# Patient Record
Sex: Female | Born: 1970 | Race: Black or African American | Hispanic: No | Marital: Single | Smoking: Never smoker
Health system: Southern US, Community
[De-identification: ages and names within clinical notes are randomized; demographics above are authoritative.]

## PROBLEM LIST (undated history)

## (undated) DIAGNOSIS — I10 Essential (primary) hypertension: Secondary | ICD-10-CM

## (undated) DIAGNOSIS — J45909 Unspecified asthma, uncomplicated: Secondary | ICD-10-CM

## (undated) DIAGNOSIS — R569 Unspecified convulsions: Secondary | ICD-10-CM

## (undated) DIAGNOSIS — D259 Leiomyoma of uterus, unspecified: Secondary | ICD-10-CM

## (undated) DIAGNOSIS — M797 Fibromyalgia: Secondary | ICD-10-CM

## (undated) DIAGNOSIS — K219 Gastro-esophageal reflux disease without esophagitis: Secondary | ICD-10-CM

## (undated) HISTORY — DX: Unspecified asthma, uncomplicated: J45.909

## (undated) HISTORY — DX: Essential (primary) hypertension: I10

---

## 1997-11-12 ENCOUNTER — Other Ambulatory Visit: Admission: RE | Admit: 1997-11-12 | Discharge: 1997-11-12 | Payer: Self-pay | Admitting: Obstetrics

## 1998-01-07 ENCOUNTER — Emergency Department (HOSPITAL_COMMUNITY): Admission: EM | Admit: 1998-01-07 | Discharge: 1998-01-08 | Payer: Self-pay | Admitting: Emergency Medicine

## 1998-02-26 ENCOUNTER — Emergency Department (HOSPITAL_COMMUNITY): Admission: EM | Admit: 1998-02-26 | Discharge: 1998-02-26 | Payer: Self-pay | Admitting: Emergency Medicine

## 1998-03-03 ENCOUNTER — Encounter: Payer: Self-pay | Admitting: Emergency Medicine

## 1998-03-03 ENCOUNTER — Emergency Department (HOSPITAL_COMMUNITY): Admission: EM | Admit: 1998-03-03 | Discharge: 1998-03-03 | Payer: Self-pay | Admitting: Emergency Medicine

## 1998-06-17 ENCOUNTER — Emergency Department (HOSPITAL_COMMUNITY): Admission: EM | Admit: 1998-06-17 | Discharge: 1998-06-17 | Payer: Self-pay | Admitting: Emergency Medicine

## 1998-08-14 ENCOUNTER — Emergency Department (HOSPITAL_COMMUNITY): Admission: EM | Admit: 1998-08-14 | Discharge: 1998-08-14 | Payer: Self-pay | Admitting: Emergency Medicine

## 1998-09-07 ENCOUNTER — Emergency Department (HOSPITAL_COMMUNITY): Admission: EM | Admit: 1998-09-07 | Discharge: 1998-09-07 | Payer: Self-pay | Admitting: Emergency Medicine

## 1998-09-07 ENCOUNTER — Encounter: Payer: Self-pay | Admitting: Emergency Medicine

## 1998-09-08 ENCOUNTER — Emergency Department (HOSPITAL_COMMUNITY): Admission: EM | Admit: 1998-09-08 | Discharge: 1998-09-08 | Payer: Self-pay | Admitting: Emergency Medicine

## 1998-10-23 ENCOUNTER — Encounter: Payer: Self-pay | Admitting: Emergency Medicine

## 1998-10-23 ENCOUNTER — Emergency Department (HOSPITAL_COMMUNITY): Admission: EM | Admit: 1998-10-23 | Discharge: 1998-10-23 | Payer: Self-pay | Admitting: Emergency Medicine

## 1999-01-14 ENCOUNTER — Encounter: Payer: Self-pay | Admitting: Emergency Medicine

## 1999-01-14 ENCOUNTER — Emergency Department (HOSPITAL_COMMUNITY): Admission: EM | Admit: 1999-01-14 | Discharge: 1999-01-15 | Payer: Self-pay | Admitting: Emergency Medicine

## 1999-03-30 ENCOUNTER — Emergency Department (HOSPITAL_COMMUNITY): Admission: EM | Admit: 1999-03-30 | Discharge: 1999-03-30 | Payer: Self-pay | Admitting: Emergency Medicine

## 1999-07-21 ENCOUNTER — Other Ambulatory Visit: Admission: RE | Admit: 1999-07-21 | Discharge: 1999-07-21 | Payer: Self-pay | Admitting: Obstetrics

## 1999-10-18 ENCOUNTER — Emergency Department (HOSPITAL_COMMUNITY): Admission: EM | Admit: 1999-10-18 | Discharge: 1999-10-19 | Payer: Self-pay | Admitting: Emergency Medicine

## 2000-07-31 ENCOUNTER — Other Ambulatory Visit: Admission: RE | Admit: 2000-07-31 | Discharge: 2000-07-31 | Payer: Self-pay | Admitting: Obstetrics

## 2001-03-17 ENCOUNTER — Emergency Department (HOSPITAL_COMMUNITY): Admission: EM | Admit: 2001-03-17 | Discharge: 2001-03-17 | Payer: Self-pay | Admitting: *Deleted

## 2001-10-24 ENCOUNTER — Emergency Department (HOSPITAL_COMMUNITY): Admission: EM | Admit: 2001-10-24 | Discharge: 2001-10-24 | Payer: Self-pay | Admitting: Emergency Medicine

## 2002-12-03 ENCOUNTER — Emergency Department (HOSPITAL_COMMUNITY): Admission: EM | Admit: 2002-12-03 | Discharge: 2002-12-03 | Payer: Self-pay | Admitting: Emergency Medicine

## 2002-12-20 ENCOUNTER — Encounter: Admission: RE | Admit: 2002-12-20 | Discharge: 2002-12-20 | Payer: Self-pay | Admitting: Internal Medicine

## 2003-02-26 ENCOUNTER — Emergency Department (HOSPITAL_COMMUNITY): Admission: AD | Admit: 2003-02-26 | Discharge: 2003-02-26 | Payer: Self-pay | Admitting: Family Medicine

## 2003-09-22 HISTORY — PX: OTHER SURGICAL HISTORY: SHX169

## 2004-01-26 HISTORY — PX: TARSAL TUNNEL RELEASE: SHX5042

## 2004-01-26 HISTORY — PX: NEURECTOMY FOOT: SUR888

## 2005-10-20 ENCOUNTER — Emergency Department (HOSPITAL_COMMUNITY): Admission: EM | Admit: 2005-10-20 | Discharge: 2005-10-20 | Payer: Self-pay | Admitting: Emergency Medicine

## 2005-11-09 ENCOUNTER — Encounter: Admission: RE | Admit: 2005-11-09 | Discharge: 2005-12-13 | Payer: Self-pay | Admitting: Podiatry

## 2006-02-24 ENCOUNTER — Emergency Department (HOSPITAL_COMMUNITY): Admission: EM | Admit: 2006-02-24 | Discharge: 2006-02-24 | Payer: Self-pay | Admitting: Emergency Medicine

## 2006-04-15 ENCOUNTER — Encounter: Admission: RE | Admit: 2006-04-15 | Discharge: 2006-04-15 | Payer: Self-pay | Admitting: Neurology

## 2007-05-07 ENCOUNTER — Emergency Department (HOSPITAL_COMMUNITY): Admission: EM | Admit: 2007-05-07 | Discharge: 2007-05-07 | Payer: Self-pay | Admitting: Emergency Medicine

## 2007-07-02 HISTORY — PX: TARSAL TUNNEL RELEASE: SHX5042

## 2007-07-02 HISTORY — PX: OTHER SURGICAL HISTORY: SHX169

## 2007-11-14 ENCOUNTER — Emergency Department (HOSPITAL_COMMUNITY): Admission: EM | Admit: 2007-11-14 | Discharge: 2007-11-14 | Payer: Self-pay | Admitting: Emergency Medicine

## 2008-02-09 ENCOUNTER — Emergency Department (HOSPITAL_COMMUNITY): Admission: EM | Admit: 2008-02-09 | Discharge: 2008-02-09 | Payer: Self-pay | Admitting: Emergency Medicine

## 2008-04-02 ENCOUNTER — Inpatient Hospital Stay (HOSPITAL_COMMUNITY): Admission: AD | Admit: 2008-04-02 | Discharge: 2008-04-02 | Payer: Self-pay | Admitting: Obstetrics

## 2008-06-09 ENCOUNTER — Encounter: Admission: RE | Admit: 2008-06-09 | Discharge: 2008-06-09 | Payer: Self-pay | Admitting: Neurology

## 2008-08-01 ENCOUNTER — Inpatient Hospital Stay (HOSPITAL_COMMUNITY): Admission: AD | Admit: 2008-08-01 | Discharge: 2008-08-01 | Payer: Self-pay | Admitting: Obstetrics

## 2008-08-27 ENCOUNTER — Emergency Department (HOSPITAL_COMMUNITY): Admission: EM | Admit: 2008-08-27 | Discharge: 2008-08-27 | Payer: Self-pay | Admitting: Emergency Medicine

## 2009-04-23 ENCOUNTER — Emergency Department (HOSPITAL_COMMUNITY): Admission: EM | Admit: 2009-04-23 | Discharge: 2009-04-23 | Payer: Self-pay | Admitting: Emergency Medicine

## 2009-11-20 ENCOUNTER — Emergency Department (HOSPITAL_COMMUNITY): Admission: EM | Admit: 2009-11-20 | Discharge: 2009-11-20 | Payer: Self-pay | Admitting: Emergency Medicine

## 2010-06-06 ENCOUNTER — Encounter: Payer: Self-pay | Admitting: Obstetrics

## 2010-07-29 ENCOUNTER — Inpatient Hospital Stay (HOSPITAL_COMMUNITY)
Admission: AD | Admit: 2010-07-29 | Discharge: 2010-07-29 | Disposition: A | Discharge status: Home / Self Care | Payer: Medicare Other | Source: Ambulatory Visit | Attending: Obstetrics | Admitting: Obstetrics

## 2010-07-29 DIAGNOSIS — N760 Acute vaginitis: Secondary | ICD-10-CM | POA: Insufficient documentation

## 2010-07-29 DIAGNOSIS — A499 Bacterial infection, unspecified: Secondary | ICD-10-CM | POA: Insufficient documentation

## 2010-07-29 DIAGNOSIS — B9689 Other specified bacterial agents as the cause of diseases classified elsewhere: Secondary | ICD-10-CM | POA: Insufficient documentation

## 2010-07-29 DIAGNOSIS — N949 Unspecified condition associated with female genital organs and menstrual cycle: Secondary | ICD-10-CM | POA: Insufficient documentation

## 2010-07-29 DIAGNOSIS — N390 Urinary tract infection, site not specified: Secondary | ICD-10-CM | POA: Insufficient documentation

## 2010-07-29 LAB — URINALYSIS, ROUTINE W REFLEX MICROSCOPIC
Ketones, ur: NEGATIVE mg/dL
Nitrite: POSITIVE — AB
Urobilinogen, UA: 1 mg/dL (ref 0.0–1.0)
pH: 6 (ref 5.0–8.0)

## 2010-07-29 LAB — WET PREP, GENITAL: Trich, Wet Prep: NONE SEEN

## 2010-07-29 LAB — URINE MICROSCOPIC-ADD ON

## 2010-07-29 LAB — POCT PREGNANCY, URINE: Preg Test, Ur: NEGATIVE

## 2010-07-30 LAB — GC/CHLAMYDIA PROBE AMP, GENITAL: GC Probe Amp, Genital: NEGATIVE

## 2010-07-31 LAB — URINE CULTURE
Colony Count: >=100000
Culture  Setup Time: 201203152225

## 2010-08-26 LAB — GC/CHLAMYDIA PROBE AMP, GENITAL
Chlamydia, DNA Probe: NEGATIVE
GC Probe Amp, Genital: NEGATIVE

## 2010-08-26 LAB — WET PREP, GENITAL: Yeast Wet Prep HPF POC: NONE SEEN

## 2010-10-07 ENCOUNTER — Ambulatory Visit: Payer: Medicare Other | Admitting: Family Medicine

## 2010-10-24 ENCOUNTER — Inpatient Hospital Stay (HOSPITAL_COMMUNITY)
Admission: AD | Admit: 2010-10-24 | Discharge: 2010-10-24 | Disposition: A | Discharge status: Home / Self Care | Payer: Medicare Other | Source: Ambulatory Visit | Attending: Obstetrics | Admitting: Obstetrics

## 2010-10-24 DIAGNOSIS — N949 Unspecified condition associated with female genital organs and menstrual cycle: Secondary | ICD-10-CM

## 2010-10-24 DIAGNOSIS — R109 Unspecified abdominal pain: Secondary | ICD-10-CM | POA: Insufficient documentation

## 2010-10-24 LAB — URINALYSIS, ROUTINE W REFLEX MICROSCOPIC
Glucose, UA: NEGATIVE mg/dL
Hgb urine dipstick: NEGATIVE
Protein, ur: NEGATIVE mg/dL
Specific Gravity, Urine: 1.025 (ref 1.005–1.030)
pH: 6 (ref 5.0–8.0)

## 2010-10-24 LAB — WET PREP, GENITAL: Yeast Wet Prep HPF POC: NONE SEEN

## 2010-10-25 LAB — POCT PREGNANCY, URINE: Preg Test, Ur: NEGATIVE

## 2010-10-26 LAB — GC/CHLAMYDIA PROBE AMP, GENITAL: Chlamydia, DNA Probe: NEGATIVE

## 2011-02-16 LAB — URINALYSIS, ROUTINE W REFLEX MICROSCOPIC
Glucose, UA: NEGATIVE
Hgb urine dipstick: NEGATIVE
Nitrite: NEGATIVE
Specific Gravity, Urine: 1.015

## 2011-02-16 LAB — WET PREP, GENITAL: Trich, Wet Prep: NONE SEEN

## 2011-02-16 LAB — GC/CHLAMYDIA PROBE AMP, GENITAL: Chlamydia, DNA Probe: NEGATIVE

## 2011-02-16 LAB — POCT PREGNANCY, URINE: Preg Test, Ur: NEGATIVE

## 2011-06-14 ENCOUNTER — Emergency Department (HOSPITAL_COMMUNITY)
Admission: EM | Admit: 2011-06-14 | Discharge: 2011-06-14 | Discharge status: Against Medical Advice | Payer: PRIVATE HEALTH INSURANCE | Attending: Emergency Medicine | Admitting: Emergency Medicine

## 2011-06-14 ENCOUNTER — Encounter (HOSPITAL_COMMUNITY): Payer: Self-pay | Admitting: *Deleted

## 2011-06-14 DIAGNOSIS — M2550 Pain in unspecified joint: Secondary | ICD-10-CM | POA: Insufficient documentation

## 2011-06-14 HISTORY — DX: Unspecified convulsions: R56.9

## 2011-06-14 HISTORY — DX: Fibromyalgia: M79.7

## 2011-06-14 NOTE — ED Notes (Addendum)
Patient states she has hx of fibromyalgia.  She states she has noted pain and aching and pins and needs in her left arm and hand.  She states it hurts when she moves her arm.

## 2012-08-06 ENCOUNTER — Encounter: Payer: Self-pay | Admitting: *Deleted

## 2012-08-07 ENCOUNTER — Encounter: Payer: Self-pay | Admitting: *Deleted

## 2012-08-08 ENCOUNTER — Encounter: Payer: Self-pay | Admitting: Podiatry

## 2012-08-08 ENCOUNTER — Ambulatory Visit (INDEPENDENT_AMBULATORY_CARE_PROVIDER_SITE_OTHER): Payer: Medicare Other | Admitting: Podiatry

## 2012-08-08 ENCOUNTER — Ambulatory Visit: Payer: Self-pay | Admitting: Podiatry

## 2012-08-08 VITALS — BP 132/95 | HR 69 | Ht 59.0 in | Wt 148.0 lb

## 2012-08-08 DIAGNOSIS — M25579 Pain in unspecified ankle and joints of unspecified foot: Secondary | ICD-10-CM | POA: Insufficient documentation

## 2012-08-08 MED ORDER — HYDROCODONE-IBUPROFEN 7.5-200 MG PO TABS: ORAL_TABLET | ORAL | Status: DC

## 2012-08-08 NOTE — Progress Notes (Signed)
Subjective: 42 y.o. year old female patient presents complaining of pain and swelling on both ankles x few weeks. Stated that she has been on her feet more than usual recently.  Stated that she attended school and will graduate this May, receiving GED [employer] School Diploma equivalent).   Reviewed medication, allergies, medial and surgical histories.   Review of Systems - General ROS: negative for - chills, fatigue, fever, hot flashes, malaise, night sweats, sleep disturbance, weight gain or weight loss.  Objective: Dermatologic:  Hypertrophic keloid right ankle from previous surgery (Tarsal tunnel release 09, Heel spur surgery 05) - part of the area is still tender when pressure is applied.  No new lesions, no open lesions noted.  Vascular: Pedal pulses are all palpable. No gross edema or erythema noted around ankle or foot, bilateral. Orthopedic: Mild Hallux valgus with bunion on right. No other gross deformities noted. Neurologic: All epicritic and tactile sensations grossly intact.  Assessment: Arthropathy ankles bilateral, possibly due to increase activity (Patient has to go up many stairs to get to her apartment).  Treatment: Reviewed clinical findings.  Discussed better shoe gear. Rx. Vicodin 7.5-200 as per request.

## 2012-08-08 NOTE — Patient Instructions (Addendum)
Seen for bilateral foot pain. Findings include normal ankle joint and rearfoot joints. No new findings since last visit. May have been on feet too much after been prolong inactivity.  Please exercise and gradually increase lower leg activity. May take one pain pill  Every 6 hrs if needed.

## 2012-08-20 ENCOUNTER — Telehealth: Payer: Self-pay | Admitting: Podiatry

## 2012-08-20 NOTE — Telephone Encounter (Signed)
Call this am, Request Rx for Plain Hydrocodone, Last rx made her have headaches and dizzy.

## 2012-10-24 ENCOUNTER — Ambulatory Visit: Payer: PRIVATE HEALTH INSURANCE | Admitting: Podiatry

## 2012-10-31 ENCOUNTER — Ambulatory Visit (INDEPENDENT_AMBULATORY_CARE_PROVIDER_SITE_OTHER): Payer: PRIVATE HEALTH INSURANCE | Admitting: Podiatry

## 2012-10-31 VITALS — BP 123/90 | HR 70

## 2012-10-31 DIAGNOSIS — M25579 Pain in unspecified ankle and joints of unspecified foot: Secondary | ICD-10-CM

## 2012-10-31 DIAGNOSIS — G5751 Tarsal tunnel syndrome, right lower limb: Secondary | ICD-10-CM

## 2012-10-31 DIAGNOSIS — G5750 Tarsal tunnel syndrome, unspecified lower limb: Secondary | ICD-10-CM

## 2012-10-31 MED ORDER — HYDROCODONE-IBUPROFEN 7.5-200 MG PO TABS: ORAL_TABLET | ORAL | Status: DC

## 2012-10-31 NOTE — Progress Notes (Signed)
Subjective: Right ankle swollen and pain x 2 weeks. Not bad if not on too long.  Pain on right foot at old surgery site at about incision line, (Tarsal tunnel surgery)started spreading and sets on ankle and toes. This is new problem. Her mother is sick with cancer and had to help her constantly going back and forth. 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 Patient is moving this September.   Objective: Very sensitive to touch along the old keloid tissue at Tarsal tunnel area. Much more pain than it used to be. Hallux valgus with bunion bilateral. Assessment: Tarsal tunnel syndrome, recurrent.   Plan:  Reviewed treatment options; injection, orthotics, shoe, pain medication, possible surgery.  Stated last injection made her almost pass out.  Patient wants to try pain medication at this time.  Explained she forms keloid and surgery may not be a good choice for elective surgery.

## 2012-12-21 ENCOUNTER — Ambulatory Visit: Payer: PRIVATE HEALTH INSURANCE | Admitting: Podiatry

## 2012-12-26 ENCOUNTER — Ambulatory Visit (INDEPENDENT_AMBULATORY_CARE_PROVIDER_SITE_OTHER): Payer: PRIVATE HEALTH INSURANCE | Admitting: Podiatry

## 2012-12-26 DIAGNOSIS — G5750 Tarsal tunnel syndrome, unspecified lower limb: Secondary | ICD-10-CM

## 2012-12-26 DIAGNOSIS — M25579 Pain in unspecified ankle and joints of unspecified foot: Secondary | ICD-10-CM

## 2012-12-26 DIAGNOSIS — G5751 Tarsal tunnel syndrome, right lower limb: Secondary | ICD-10-CM

## 2012-12-26 MED ORDER — HYDROCODONE-IBUPROFEN 7.5-200 MG PO TABS: ORAL_TABLET | ORAL | Status: DC

## 2012-12-26 NOTE — Progress Notes (Signed)
Pain on right ankle instep and over old incision. Using CAM walker and pain on top of foot. Patient request pain medication refilled.  Assessment: Recurrent Tarsal tunnel syndrome. Keloid formation at surgery site. Pain at surgery site.  P: Will switch to ankle brace from CAM walker. Patient refused cortisone injection in past repeatedly.  Prop up when swells. Pain pills as needed.

## 2013-01-22 ENCOUNTER — Telehealth: Payer: Self-pay | Admitting: Podiatry

## 2013-01-22 NOTE — Telephone Encounter (Signed)
Isabel Shaw called today stating could she get some more pain Med. She also said she has misplaced her last Rx Bottle and Cant find it. I told Isabel Shaw she will have to call back next week because Dr. Raynald Kemp is unavailable this week.

## 2013-01-29 ENCOUNTER — Telehealth: Payer: Self-pay | Admitting: Podiatry

## 2013-01-29 NOTE — Telephone Encounter (Signed)
Patient called today 01/29/2013 requesting a RX for Foot pain, stating she has misplaced her Medicine last given to her. Pt did call last week while Dr. Raynald Kemp was away also.

## 2013-03-30 ENCOUNTER — Emergency Department (INDEPENDENT_AMBULATORY_CARE_PROVIDER_SITE_OTHER)
Admission: EM | Admit: 2013-03-30 | Discharge: 2013-03-30 | Disposition: A | Discharge status: Home / Self Care | Payer: PRIVATE HEALTH INSURANCE | Source: Home / Self Care | Attending: Family Medicine | Admitting: Family Medicine

## 2013-03-30 ENCOUNTER — Encounter (HOSPITAL_COMMUNITY): Payer: Self-pay | Admitting: Emergency Medicine

## 2013-03-30 DIAGNOSIS — K0889 Other specified disorders of teeth and supporting structures: Secondary | ICD-10-CM

## 2013-03-30 DIAGNOSIS — K089 Disorder of teeth and supporting structures, unspecified: Secondary | ICD-10-CM

## 2013-03-30 MED ORDER — MELOXICAM 15 MG PO TABS: 15.0000 mg | ORAL_TABLET | Freq: Every day | ORAL | Status: DC | PRN

## 2013-03-30 MED ORDER — AMOXICILLIN 500 MG PO CAPS: 500.0000 mg | ORAL_CAPSULE | Freq: Three times a day (TID) | ORAL | Status: DC

## 2013-03-30 NOTE — ED Notes (Signed)
Pt c/o dental pain onset 3 weeks on left upper side Sxs also include: swelling and pain that radiates to left side of head She is alert w/no signs of acute distress.

## 2013-03-30 NOTE — ED Provider Notes (Signed)
Isabel Shaw is a 42 y.o. female who presents to Urgent Care today for tooth pain for 3 weeks off and on upper left tooth. Patient notes a crack in her upper left incisor.  The pain worsened recently. She has not tried any medications. She does have a [employer] insurance but has not contacted the dentist. She feels well otherwise no fevers or chills nausea vomiting or diarrhea.   Past Medical History  Diagnosis Date  . Fibromyalgia   . Seizures   . Asthma    History  Substance Use Topics  . Smoking status: Never Smoker   . Smokeless tobacco: Never Used  . Alcohol Use: No   ROS as above Medications reviewed. No current facility-administered medications for this encounter.   Current Outpatient Prescriptions  Medication Sig Dispense Refill  . amitriptyline (ELAVIL) 50 MG tablet Take 50 mg by mouth at bedtime.      Marland Kitchen amoxicillin (AMOXIL) 500 MG capsule Take 1 capsule (500 mg total) by mouth 3 (three) times daily.  30 capsule  0  . azithromycin (ZITHROMAX) 500 MG tablet Take 500 mg by mouth daily.       . fluticasone (FLOVENT HFA) 220 MCG/ACT inhaler Inhale 1 puff into the lungs 2 (two) times daily.      . Fluticasone-Salmeterol (ADVAIR) 250-50 MCG/DOSE AEPB Inhale 1 puff into the lungs every 12 (twelve) hours.      . gabapentin (NEURONTIN) 300 MG capsule Take 600 mg by mouth 2 (two) times daily.      Marland Kitchen HYDROcodone-ibuprofen (VICOPROFEN) 7.5-200 MG per tablet Take one every 8-12 hours for pain as needed.  90 tablet  0  . ibuprofen (ADVIL,MOTRIN) 800 MG tablet       . levETIRAcetam (KEPPRA) 500 MG tablet Take 500 mg by mouth 2 (two) times daily.      . meloxicam (MOBIC) 15 MG tablet Take 1 tablet (15 mg total) by mouth daily as needed for pain.  30 tablet  0  . predniSONE (DELTASONE) 10 MG tablet Take 10 mg by mouth 2 (two) times daily.      Marland Kitchen rOPINIRole (REQUIP) 0.25 MG tablet Take 0.25 mg by mouth every evening.      Marland Kitchen tiZANidine (ZANAFLEX) 4 MG tablet Take 4 mg by mouth  every 6 (six) hours as needed. For muscle spasms      . zolpidem (AMBIEN) 10 MG tablet Take 10 mg by mouth at bedtime as needed. For sleep        Exam:  BP 127/91  Pulse 61  Temp(Src) 98.1 F (36.7 C) (Oral)  Resp 18  SpO2 99%  LMP 03/06/2013 Gen: Well NAD HEENT: EOMI,  MMM.  Tooth: Small crack extending to the base of the tooth upper left incisor. Tender to touch. No significant surrounding erythema.   No results found for this or any previous visit (from the past 24 hour(s)). No results found.  Assessment and Plan: 42 y.o. female with tooth pain.  Plan to treat with meloxicam and amoxicillin. Followup with dentist. Discussed warning signs or symptoms. Please see discharge instructions. Patient expresses understanding.      Rodolph Bong, MD 03/30/13 1130

## 2013-05-28 ENCOUNTER — Encounter: Payer: Self-pay | Admitting: Podiatry

## 2013-05-28 ENCOUNTER — Ambulatory Visit (INDEPENDENT_AMBULATORY_CARE_PROVIDER_SITE_OTHER): Payer: PRIVATE HEALTH INSURANCE | Admitting: Podiatry

## 2013-05-28 VITALS — BP 143/92 | HR 67

## 2013-05-28 DIAGNOSIS — M21611 Bunion of right foot: Secondary | ICD-10-CM | POA: Insufficient documentation

## 2013-05-28 DIAGNOSIS — M21619 Bunion of unspecified foot: Secondary | ICD-10-CM

## 2013-05-28 DIAGNOSIS — M25579 Pain in unspecified ankle and joints of unspecified foot: Secondary | ICD-10-CM

## 2013-05-28 MED ORDER — HYDROCODONE-IBUPROFEN 7.5-200 MG PO TABS: ORAL_TABLET | ORAL | Status: DC

## 2013-05-28 NOTE — Patient Instructions (Signed)
Seen for pain on right foot. Noted of enlarged bunion on right foot.  Pain pill prescription renewed. Return as needed.

## 2013-05-28 NOTE — Progress Notes (Signed)
Foot rolls to side and hurts on top and side of foot when on feet. Pain stays on constantly as long as she is on her feet. Patient request pain medication refilled.  Objective: Status post Tarsal tunnel surgery more than 10 years ago and has build up excess keloid at medial aspect of the right ankle along the incision line.  No edema or erythema noted.  Positive of hallux valgus with bunion deformity right.   Assessment: Symptomatic HAV with bunion right.  Pain upon ambulation.   Plan:  Reviewed findings.  Rx renewed for Hydrocodone.

## 2013-07-23 ENCOUNTER — Telehealth: Payer: Self-pay | Admitting: *Deleted

## 2013-07-23 MED ORDER — HYDROCODONE-IBUPROFEN 7.5-200 MG PO TABS: ORAL_TABLET | ORAL | Status: DC

## 2013-07-23 NOTE — Telephone Encounter (Signed)
Patient request refill on her vicodin please.

## 2013-09-17 ENCOUNTER — Encounter: Payer: Self-pay | Admitting: Podiatry

## 2013-09-17 ENCOUNTER — Telehealth: Payer: Self-pay | Admitting: *Deleted

## 2013-09-17 ENCOUNTER — Ambulatory Visit (INDEPENDENT_AMBULATORY_CARE_PROVIDER_SITE_OTHER): Payer: PRIVATE HEALTH INSURANCE | Admitting: Podiatry

## 2013-09-17 VITALS — BP 131/98 | HR 56 | Ht 59.0 in | Wt 144.0 lb

## 2013-09-17 DIAGNOSIS — L989 Disorder of the skin and subcutaneous tissue, unspecified: Secondary | ICD-10-CM | POA: Insufficient documentation

## 2013-09-17 DIAGNOSIS — M25579 Pain in unspecified ankle and joints of unspecified foot: Secondary | ICD-10-CM

## 2013-09-17 DIAGNOSIS — R234 Changes in skin texture: Secondary | ICD-10-CM | POA: Insufficient documentation

## 2013-09-17 MED ORDER — HYDROCODONE-ACETAMINOPHEN 7.5-325 MG PO TABS: 1.0000 | ORAL_TABLET | Freq: Four times a day (QID) | ORAL | Status: DC | PRN

## 2013-09-17 MED ORDER — DIMETHICONE 2 % EX CREA: 1.0000 "application " | TOPICAL_CREAM | Freq: Two times a day (BID) | CUTANEOUS | Status: DC

## 2013-09-17 NOTE — Patient Instructions (Signed)
Seen for painful scar. Mederma sample dispensed and rx sent. Pain medication also prescribed as per request. Also may benefit from cortisone injection.  Return as needed.

## 2013-09-17 NOTE — Progress Notes (Signed)
Subjective: 43 year old female presents complaining of pain from right ankle scar that swells and rubs in shoes. Scar on right medial ankle from old incision from Tarsal tunnel release surgery done about 10 years or longer.  Pain gets bad and some times prevents her from standing on her feet. Shooting or stabbing on both feet. Right foot hurts at scarred area, left foot hurt at plantar arch area.   Objective: Severe keloid problem on right ankle from previous surgery at tarsal tunnel area.  Subjective: Painful scar right ankle.   Plan: Will try Mederma. Pain medication prescribed. Return as needed.

## 2013-09-17 NOTE — Telephone Encounter (Signed)
Call from [facility] with question @ electronic Rx sent for Pt. [phone]  He said it was for Aveeno.. Over the counter. We looked in chart and told him what it was.

## 2013-10-09 ENCOUNTER — Other Ambulatory Visit: Payer: Self-pay

## 2013-10-10 ENCOUNTER — Encounter (HOSPITAL_COMMUNITY): Payer: Self-pay | Admitting: Emergency Medicine

## 2013-10-10 ENCOUNTER — Emergency Department (INDEPENDENT_AMBULATORY_CARE_PROVIDER_SITE_OTHER): Payer: PRIVATE HEALTH INSURANCE

## 2013-10-10 ENCOUNTER — Emergency Department (INDEPENDENT_AMBULATORY_CARE_PROVIDER_SITE_OTHER)
Admission: EM | Admit: 2013-10-10 | Discharge: 2013-10-10 | Disposition: A | Discharge status: Home / Self Care | Payer: PRIVATE HEALTH INSURANCE | Source: Home / Self Care | Attending: Family Medicine | Admitting: Family Medicine

## 2013-10-10 DIAGNOSIS — M79609 Pain in unspecified limb: Secondary | ICD-10-CM

## 2013-10-10 DIAGNOSIS — M79672 Pain in left foot: Secondary | ICD-10-CM

## 2013-10-10 MED ORDER — DICLOFENAC SODIUM 50 MG PO TBEC: 50.0000 mg | DELAYED_RELEASE_TABLET | Freq: Two times a day (BID) | ORAL | Status: DC | PRN

## 2013-10-10 NOTE — Discharge Instructions (Signed)
Thank you for coming in today. Use your rigid sole shoe.  Take diclofenac for pain as needed Followup with your foot doctor soon.

## 2013-10-10 NOTE — ED Provider Notes (Signed)
Isabel Shaw is a 43 y.o. female who presents to Urgent Care today for left foot pain. Patient notes Pain in the left dorsal foot 4 days without injury. The pain is felt at the mid tarsal metatarsal joints. There is worse with prolonged standing and better with rest. No radiating pain weakness or numbness. She has no tried any medications yet. She feels well otherwise.   Past Medical History  Diagnosis Date  . Fibromyalgia   . Seizures   . Asthma    History  Substance Use Topics  . Smoking status: Never Smoker   . Smokeless tobacco: Never Used  . Alcohol Use: No   ROS as above Medications: No current facility-administered medications for this encounter.   Current Outpatient Prescriptions  Medication Sig Dispense Refill  . amitriptyline (ELAVIL) 50 MG tablet Take 50 mg by mouth at bedtime.      . diclofenac (VOLTAREN) 50 MG EC tablet Take 1 tablet (50 mg total) by mouth 2 (two) times daily as needed.  60 tablet  0  . DIMETHICONE, TOPICAL, 2 % CREA Apply 1 application topically 2 (two) times daily.  30 g  2  . fluticasone (FLONASE) 50 MCG/ACT nasal spray       . fluticasone (FLOVENT HFA) 220 MCG/ACT inhaler Inhale 1 puff into the lungs 2 (two) times daily.      . Fluticasone-Salmeterol (ADVAIR) 250-50 MCG/DOSE AEPB Inhale 1 puff into the lungs every 12 (twelve) hours.      . gabapentin (NEURONTIN) 300 MG capsule Take 600 mg by mouth 2 (two) times daily.      Marland Kitchen HYDROcodone-acetaminophen (NORCO) 7.5-325 MG per tablet Take 1 tablet by mouth every 6 (six) hours as needed for moderate pain.  60 tablet  0  . levETIRAcetam (KEPPRA) 500 MG tablet Take 500 mg by mouth 2 (two) times daily.      Marland Kitchen rOPINIRole (REQUIP) 0.25 MG tablet Take 0.25 mg by mouth every evening.      Marland Kitchen tiZANidine (ZANAFLEX) 4 MG tablet Take 4 mg by mouth every 6 (six) hours as needed. For muscle spasms      . zolpidem (AMBIEN) 10 MG tablet Take 10 mg by mouth at bedtime as needed. For sleep        Exam:  BP 129/89   Pulse 64  Temp(Src) 98.6 F (37 C) (Oral)  SpO2 99%  LMP 09/26/2013 Gen: Well NAD Left foot. Normal-appearing. No significant breakdown of the arch. Tender palpation across the dorsal midfoot at the TMT joint. Pain with rotation of the foot. The ankle is nontender with full motion and stable ligamentous exam. Refill and pulses are intact distally. No ecchymosis or swelling noted.  No results found for this or any previous visit (from the past 24 hour(s)). Dg Foot Complete Left  10/10/2013   CLINICAL DATA:  Dorsal LEFT foot pain for 3 days, no known injury  EXAM: LEFT FOOT - COMPLETE 3+ VIEW  COMPARISON:  None.  FINDINGS: Osseous mineralization normal.  Joint spaces preserved.  No fracture, dislocation, or bone destruction.  IMPRESSION: Normal exam.   Electronically Signed   By: Lavonia Dana M.D.   On: 10/10/2013 20:13    Assessment and Plan: 43 y.o. female with midfoot pain without clear explanation. Possible overuse. Doubtful for Lisfranc injury given no injury history.  Plan for postoperative shoe and NSAIDs. Followup podiatry in a few days.  Discussed warning signs or symptoms. Please see discharge instructions. Patient expresses understanding.  Gregor Hams, MD 10/10/13 2029

## 2013-10-10 NOTE — ED Notes (Signed)
Pt c/o pain on top of left foot onset 4 days Denies inj/trauma but states she's on her feet all day at work Pain increases w/activity and when bearing wt.  Alert w/no signs of acute distress.

## 2013-10-18 ENCOUNTER — Encounter: Payer: Self-pay | Admitting: Podiatry

## 2013-10-18 ENCOUNTER — Ambulatory Visit (INDEPENDENT_AMBULATORY_CARE_PROVIDER_SITE_OTHER): Payer: PRIVATE HEALTH INSURANCE | Admitting: Podiatry

## 2013-10-18 VITALS — BP 123/88 | HR 63

## 2013-10-18 DIAGNOSIS — M25579 Pain in unspecified ankle and joints of unspecified foot: Secondary | ICD-10-CM

## 2013-10-18 DIAGNOSIS — M722 Plantar fascial fibromatosis: Secondary | ICD-10-CM | POA: Insufficient documentation

## 2013-10-18 MED ORDER — HYDROCODONE-ACETAMINOPHEN 7.5-325 MG PO TABS: 1.0000 | ORAL_TABLET | Freq: Four times a day (QID) | ORAL | Status: DC | PRN

## 2013-10-18 NOTE — Patient Instructions (Signed)
Seen for pain in left foot arch area.  May benefit from well supported Tennis shoes with inserts (Custom or OTC).  May take NSAIA (Voltaren) and pain pill if needed.

## 2013-10-18 NOTE — Progress Notes (Signed)
Subjective: Pain under left foot arch area. She was seen at urgent care last week. Pain is like coming from a knot. Works two days a week.   Objective: Dermatologic: Normal skin without open lesions. Vascular: All pedal pulses are palpable. In edema or erythema. No increase in temperature noted. Neurologic: All epicritic and tactile sensations grossly intact.  Orthopedic: Hallux valgus with bunion left. Hypermobile first ray left. Pain under instep plantar fascial area with pressure. No palpable mass noted.  Assessment: Acute plantar fasciitis at center mid portion of foot.  Plan:  May benefit from well supported Tennis shoes with inserts (Custom or OTC).  May take NSAIA (Voltaren) and pain pill if needed.

## 2013-12-11 ENCOUNTER — Ambulatory Visit: Payer: PRIVATE HEALTH INSURANCE | Admitting: Podiatry

## 2013-12-20 ENCOUNTER — Encounter: Payer: Self-pay | Admitting: Podiatry

## 2013-12-20 ENCOUNTER — Ambulatory Visit (INDEPENDENT_AMBULATORY_CARE_PROVIDER_SITE_OTHER): Payer: PRIVATE HEALTH INSURANCE | Admitting: Podiatry

## 2013-12-20 VITALS — BP 143/102 | HR 66

## 2013-12-20 DIAGNOSIS — M25572 Pain in left ankle and joints of left foot: Secondary | ICD-10-CM

## 2013-12-20 DIAGNOSIS — M25579 Pain in unspecified ankle and joints of unspecified foot: Secondary | ICD-10-CM

## 2013-12-20 DIAGNOSIS — M21962 Unspecified acquired deformity of left lower leg: Secondary | ICD-10-CM | POA: Insufficient documentation

## 2013-12-20 DIAGNOSIS — M21969 Unspecified acquired deformity of unspecified lower leg: Secondary | ICD-10-CM

## 2013-12-20 MED ORDER — HYDROCODONE-ACETAMINOPHEN 7.5-325 MG PO TABS: 1.0000 | ORAL_TABLET | Freq: Four times a day (QID) | ORAL | Status: DC | PRN

## 2013-12-20 NOTE — Patient Instructions (Signed)
Seen for pain in left ankle. Both feet have bunion and abnormal bone alignment. Due to existing Keloid problem, surgery will be reserved for a last resort.  Pain medication prescribed as per request.

## 2013-12-20 NOTE — Progress Notes (Signed)
Left foot pain during ambulation at ankle joint. Patient is wearing flat sandals. Both ankles swell with certain weather.   Objective: Hallux valgus with bunion bilateral. Excess sagittal plane motion first ray bilateral. Good ankle joint motion. Existing keloid on right medial ankle.   Assessment: Pain left ankle joint. HAV with bunion bilateral. Hypermobile first ray bilateral.  Plan: Reviewed findings and available options. Patient is to wear laced up shoes. Patient has keloid problem and surgery will be reserved as a last resort.  Pain medication prescribed as per request.

## 2014-02-28 ENCOUNTER — Ambulatory Visit (INDEPENDENT_AMBULATORY_CARE_PROVIDER_SITE_OTHER): Payer: PRIVATE HEALTH INSURANCE | Admitting: Podiatry

## 2014-02-28 ENCOUNTER — Encounter: Payer: Self-pay | Admitting: Podiatry

## 2014-02-28 VITALS — BP 144/94 | HR 69

## 2014-02-28 DIAGNOSIS — M21962 Unspecified acquired deformity of left lower leg: Secondary | ICD-10-CM

## 2014-02-28 DIAGNOSIS — M722 Plantar fascial fibromatosis: Secondary | ICD-10-CM

## 2014-02-28 MED ORDER — HYDROCODONE-ACETAMINOPHEN 7.5-325 MG PO TABS: 1.0000 | ORAL_TABLET | Freq: Four times a day (QID) | ORAL | Status: DC | PRN

## 2014-02-28 NOTE — Progress Notes (Signed)
Subjective: 43 year old female presents complaining of pain on left foot.  Been working 3 days a week 5 hours on feet x 2 weeks. Left foot hurt more than right.  Ran out of pain pills that she had to take at bed time. Pain is at instep area left foot.   Objective: Neurovascular status are within normal.  Pain under left arch. Excess first ray sagittal plane motion bilateral.  History of keloid formation right ankle.  Assessment: Plantar fasciitis left.   Plan: Proper shoe gear, lace up tennis shoes. Cutdown on hours on feet to tolerable level.  Pain pill only if needed. Advised not to rake it as a daily routine.

## 2014-02-28 NOTE — Patient Instructions (Signed)
Seen for left arch pain. Need to reduce hours on feet, wear well supported shoes, and take pain pill only if needed. Return as needed.

## 2014-05-21 ENCOUNTER — Emergency Department (HOSPITAL_COMMUNITY)
Admission: EM | Admit: 2014-05-21 | Discharge: 2014-05-21 | Disposition: A | Discharge status: Home / Self Care | Payer: Medicare Other | Attending: Emergency Medicine | Admitting: Emergency Medicine

## 2014-05-21 ENCOUNTER — Encounter (HOSPITAL_COMMUNITY): Payer: Self-pay | Admitting: *Deleted

## 2014-05-21 DIAGNOSIS — R0600 Dyspnea, unspecified: Secondary | ICD-10-CM

## 2014-05-21 DIAGNOSIS — R42 Dizziness and giddiness: Secondary | ICD-10-CM | POA: Diagnosis present

## 2014-05-21 DIAGNOSIS — Z79899 Other long term (current) drug therapy: Secondary | ICD-10-CM | POA: Insufficient documentation

## 2014-05-21 DIAGNOSIS — R0602 Shortness of breath: Secondary | ICD-10-CM | POA: Diagnosis not present

## 2014-05-21 DIAGNOSIS — R202 Paresthesia of skin: Secondary | ICD-10-CM | POA: Diagnosis not present

## 2014-05-21 DIAGNOSIS — M797 Fibromyalgia: Secondary | ICD-10-CM | POA: Diagnosis not present

## 2014-05-21 DIAGNOSIS — J45901 Unspecified asthma with (acute) exacerbation: Secondary | ICD-10-CM | POA: Insufficient documentation

## 2014-05-21 DIAGNOSIS — Z7951 Long term (current) use of inhaled steroids: Secondary | ICD-10-CM | POA: Insufficient documentation

## 2014-05-21 DIAGNOSIS — G40909 Epilepsy, unspecified, not intractable, without status epilepticus: Secondary | ICD-10-CM | POA: Insufficient documentation

## 2014-05-21 LAB — CBC
HCT: 38.8 % (ref 36.0–46.0)
Hemoglobin: 13.1 g/dL (ref 12.0–15.0)
MCH: 26.9 pg (ref 26.0–34.0)
MCHC: 33.8 g/dL (ref 30.0–36.0)
MCV: 79.7 fL (ref 78.0–100.0)
Platelets: 324 10*3/uL (ref 150–400)
RBC: 4.87 MIL/uL (ref 3.87–5.11)
RDW: 13.9 % (ref 11.5–15.5)
WBC: 5.6 10*3/uL (ref 4.0–10.5)

## 2014-05-21 LAB — BASIC METABOLIC PANEL
Anion gap: 7 (ref 5–15)
BUN: 8 mg/dL (ref 6–23)
CALCIUM: 9.2 mg/dL (ref 8.4–10.5)
CO2: 24 mmol/L (ref 19–32)
CREATININE: 0.79 mg/dL (ref 0.50–1.10)
Chloride: 107 mEq/L (ref 96–112)
GFR calc non Af Amer: >90 mL/min (ref >90)
GLUCOSE: 88 mg/dL (ref 70–99)
Potassium: 3.5 mmol/L (ref 3.5–5.1)
SODIUM: 138 mmol/L (ref 135–145)

## 2014-05-21 LAB — I-STAT TROPONIN, ED: TROPONIN I, POC: 0 ng/mL (ref 0.00–0.08)

## 2014-05-21 NOTE — ED Notes (Signed)
PT monitored by pulse ox, bp cuff, and 5-lead. 

## 2014-05-21 NOTE — ED Provider Notes (Signed)
CSN: [id number]     Arrival date & time 05/21/14  1549 History   First MD Initiated Contact with Patient 05/21/14 1719     Chief Complaint  Patient presents with  . Dizziness  . Fatigue     (Consider location/radiation/quality/duration/timing/severity/associated sxs/prior Treatment) Patient is a 44 y.o. female presenting with general illness.  Illness Location:  Generalized, bil ue Quality:  Weakness, dyspnea, tingling Severity:  Moderate Onset quality:  Sudden Duration: 30 min. Timing:  Constant Progression:  Resolved Chronicity:  Recurrent Context:  Ho fibromyalgia, currently under social stress Relieved by:  Nothing Worsened by:  Nothing Associated symptoms: shortness of breath   Associated symptoms: no chest pain, no cough, no fever, no headaches, no loss of consciousness, no nausea, no vomiting and no wheezing     Past Medical History  Diagnosis Date  . Fibromyalgia   . Seizures   . Asthma    Past Surgical History  Procedure Laterality Date  . Heel spur resect w/plantar fasciotomy Right 09-22-03    Right foot  . Neurectomy foot Right 01/26/04    Right foot, Plantar calc.  . Tarsal tunnel release Right 01/26/04    Right foot  . Neuroplasty digital n. Right 07/02/07    Right x 3  . Tarsal tunnel release Right 07/02/07    Right foot   History reviewed. No pertinent family history. History  Substance Use Topics  . Smoking status: Never Smoker   . Smokeless tobacco: Never Used  . Alcohol Use: No   OB History    No data available     Review of Systems  Constitutional: Negative for fever.  Respiratory: Positive for shortness of breath. Negative for cough and wheezing.   Cardiovascular: Negative for chest pain.  Gastrointestinal: Negative for nausea and vomiting.  Neurological: Negative for loss of consciousness and headaches.  All other systems reviewed and are negative.     Allergies  Review of patient's allergies indicates no known allergies.  Home  Medications   Prior to Admission medications   Medication Sig Start Date End Date Taking? Authorizing Provider  amitriptyline (ELAVIL) 50 MG tablet Take 50 mg by mouth at bedtime.    Historical Provider, MD  diclofenac (VOLTAREN) 50 MG EC tablet Take 1 tablet (50 mg total) by mouth 2 (two) times daily as needed. 10/10/13   Gregor Hams, MD  DIMETHICONE, TOPICAL, 2 % CREA Apply 1 application topically 2 (two) times daily. 09/17/13   Myeong Sheard, DPM  fluticasone (FLONASE) 50 MCG/ACT nasal spray  07/22/13   Historical Provider, MD  fluticasone (FLOVENT HFA) 220 MCG/ACT inhaler Inhale 1 puff into the lungs 2 (two) times daily.    Historical Provider, MD  Fluticasone-Salmeterol (ADVAIR) 250-50 MCG/DOSE AEPB Inhale 1 puff into the lungs every 12 (twelve) hours.    Historical Provider, MD  gabapentin (NEURONTIN) 300 MG capsule Take 600 mg by mouth 2 (two) times daily.    Historical Provider, MD  HYDROcodone-acetaminophen (NORCO) 7.5-325 MG per tablet Take 1 tablet by mouth every 6 (six) hours as needed for moderate pain. 02/28/14   Myeong Sheard, DPM  levETIRAcetam (KEPPRA) 500 MG tablet Take 500 mg by mouth 2 (two) times daily.    Historical Provider, MD  rOPINIRole (REQUIP) 0.25 MG tablet Take 0.25 mg by mouth every evening.    Historical Provider, MD  tiZANidine (ZANAFLEX) 4 MG tablet Take 4 mg by mouth every 6 (six) hours as needed. For muscle spasms    Historical Provider, MD  zolpidem (AMBIEN) 10 MG tablet Take 10 mg by mouth at bedtime as needed. For sleep    Historical Provider, MD   BP 135/92 mmHg  Pulse 61  Temp(Src) 97.6 F (36.4 C) (Oral)  Resp 20  SpO2 100% Physical Exam  Constitutional: She is oriented to person, place, and time. She appears well-developed and well-nourished.  HENT:  Head: Normocephalic and atraumatic.  Right Ear: External ear normal.  Left Ear: External ear normal.  Eyes: Conjunctivae and EOM are normal. Pupils are equal, round, and reactive to light.  Neck:  Normal range of motion. Neck supple.  Cardiovascular: Normal rate, regular rhythm, normal heart sounds and intact distal pulses.   Pulmonary/Chest: Effort normal and breath sounds normal.  Abdominal: Soft. Bowel sounds are normal. There is no tenderness.  Musculoskeletal: Normal range of motion.  Neurological: She is alert and oriented to person, place, and time. She has normal strength and normal reflexes. No cranial nerve deficit or sensory deficit. Gait normal. GCS eye subscore is 4. GCS verbal subscore is 5. GCS motor subscore is 6.  Skin: Skin is warm and dry.  Vitals reviewed.   ED Course  Procedures (including critical care time) Labs Review Labs Reviewed  [facility], ED    Imaging Review No results found.   EKG Interpretation   Date/Time:  Wednesday May 21 2014 15:57:58 EST Ventricular Rate:  69 PR Interval:  136 QRS Duration: 88 QT Interval:  398 QTC Calculation: 426 R Axis:   34 Text Interpretation:  Normal sinus rhythm Normal ECG No significant change  since last tracing Confirmed by Debby Freiberg [phone]) on 05/21/2014  5:45:54 PM      MDM   Final diagnoses:  Tingling in extremities  Dyspnea    44 y.o. female with pertinent PMH of fibromyalgia, seizures presents with recurrent symptoms as described above. Historically patient states that she is under a large deal of stress, states that her symptoms might be due to this. On arrival vital signs and physical exam as above. No focal neurodeficits, benign exam. As patient describes bilateral upper [place] tingling that is associated with shortness of breath and stress, consider anxiety most likely etiology of her symptoms. Did discuss the potential of occult pathology including strict return precautions for PE, CVA, MS. Offered patient follow-up with neurology and cardiology, she declined stating that she would follow-up with PCP. Discharged home in stable condition..    I  have reviewed all laboratory and imaging studies if ordered as above  1. Tingling in extremities   2. Dyspnea         Debby Freiberg, MD 05/21/14 [phone]

## 2014-05-21 NOTE — Discharge Instructions (Signed)
Generalized Anxiety Disorder Generalized anxiety disorder (GAD) is a mental disorder. It interferes with life functions, including relationships, work, and school. GAD is different from normal anxiety, which everyone experiences at some point in their lives in response to specific life events and activities. Normal anxiety actually helps us prepare for and get through these life events and activities. Normal anxiety goes away after the event or activity is over.  GAD causes anxiety that is not necessarily related to specific events or activities. It also causes excess anxiety in proportion to specific events or activities. The anxiety associated with GAD is also difficult to control. GAD can vary from mild to severe. People with severe GAD can have intense waves of anxiety with physical symptoms (panic attacks).  SYMPTOMS The anxiety and worry associated with GAD are difficult to control. This anxiety and worry are related to many life events and activities and also occur more days than not for 6 months or longer. People with GAD also have three or more of the following symptoms (one or more in children):  Restlessness.   Fatigue.  Difficulty concentrating.   Irritability.  Muscle tension.  Difficulty sleeping or unsatisfying sleep. DIAGNOSIS GAD is diagnosed through an assessment by your health care provider. Your health care provider will ask you questions aboutyour mood,physical symptoms, and events in your life. Your health care provider may ask you about your medical history and use of alcohol or drugs, including prescription medicines. Your health care provider may also do a physical exam and blood tests. Certain medical conditions and the use of certain substances can cause symptoms similar to those associated with GAD. Your health care provider may refer you to a mental health specialist for further evaluation. TREATMENT The following therapies are usually used to treat GAD:    Medication. Antidepressant medication usually is prescribed for long-term daily control. Antianxiety medicines may be added in severe cases, especially when panic attacks occur.   Talk therapy (psychotherapy). Certain types of talk therapy can be helpful in treating GAD by providing support, education, and guidance. A form of talk therapy called cognitive behavioral therapy can teach you healthy ways to think about and react to daily life events and activities.  Stress managementtechniques. These include yoga, meditation, and exercise and can be very helpful when they are practiced regularly. A mental health specialist can help determine which treatment is best for you. Some people see improvement with one therapy. However, other people require a combination of therapies. Document Released: 08/27/2012 Document Revised: 09/16/2013 Document Reviewed: 08/27/2012 Exit[employer] Patient Information 2015 [employer]. This information is not intended to replace advice given to you by your health care provider. Make sure you discuss any questions you have with your health care provider.  

## 2014-05-21 NOTE — ED Notes (Signed)
NAD at this time. Pt is stable and leaving with her son.

## 2014-05-21 NOTE — ED Notes (Signed)
Pt in c/o dizziness and generalized weakness for several months, states symptoms are intermittent, denies pain, states symptoms were worse today

## 2014-07-14 ENCOUNTER — Ambulatory Visit: Payer: Medicaid Other | Admitting: Podiatry

## 2014-07-14 DIAGNOSIS — Z131 Encounter for screening for diabetes mellitus: Secondary | ICD-10-CM | POA: Diagnosis not present

## 2014-07-14 DIAGNOSIS — M609 Myositis, unspecified: Secondary | ICD-10-CM | POA: Diagnosis not present

## 2014-07-14 DIAGNOSIS — N760 Acute vaginitis: Secondary | ICD-10-CM | POA: Diagnosis not present

## 2014-07-14 DIAGNOSIS — Z13220 Encounter for screening for lipoid disorders: Secondary | ICD-10-CM | POA: Diagnosis not present

## 2014-07-14 DIAGNOSIS — J4520 Mild intermittent asthma, uncomplicated: Secondary | ICD-10-CM | POA: Diagnosis not present

## 2014-07-15 ENCOUNTER — Ambulatory Visit: Payer: Medicaid Other | Admitting: Podiatry

## 2014-07-18 ENCOUNTER — Encounter: Payer: Self-pay | Admitting: Podiatry

## 2014-07-18 ENCOUNTER — Ambulatory Visit (INDEPENDENT_AMBULATORY_CARE_PROVIDER_SITE_OTHER): Payer: Medicare Other | Admitting: Podiatry

## 2014-07-18 VITALS — BP 130/85 | HR 65

## 2014-07-18 DIAGNOSIS — M79673 Pain in unspecified foot: Secondary | ICD-10-CM

## 2014-07-18 DIAGNOSIS — M21969 Unspecified acquired deformity of unspecified lower leg: Secondary | ICD-10-CM

## 2014-07-18 DIAGNOSIS — M21962 Unspecified acquired deformity of left lower leg: Secondary | ICD-10-CM

## 2014-07-18 DIAGNOSIS — M79606 Pain in leg, unspecified: Secondary | ICD-10-CM

## 2014-07-18 MED ORDER — HYDROCODONE-ACETAMINOPHEN 7.5-325 MG PO TABS: 1.0000 | ORAL_TABLET | Freq: Four times a day (QID) | ORAL | Status: DC | PRN

## 2014-07-18 NOTE — Progress Notes (Signed)
Subjective: 44 year old female presents complaining of foot pain left>right. Feels like walking on bone.  Patient points proximal lateral 1/2 of left foot being painful area. Works 4 hours, 2 days a week. Patient wants to have pain medication.   Objective: Hypermobile first ray with bunion deformity bilateral.  Neurovascular status are within normal. Old keloid remain the same on right foot. No open skin lesions.  Assessment: Tenosynovitis left>right foot. 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  Plan: Reviewed findings and available options. Patient requests pain medication at this time.

## 2014-07-18 NOTE — Patient Instructions (Signed)
Seen for pain in left foot. Rx. Prescribed as per request. Return as needed.

## 2014-08-02 ENCOUNTER — Encounter (HOSPITAL_COMMUNITY): Payer: Self-pay | Admitting: General Practice

## 2014-08-02 ENCOUNTER — Inpatient Hospital Stay (HOSPITAL_COMMUNITY)
Admission: AD | Admit: 2014-08-02 | Discharge: 2014-08-02 | Disposition: A | Discharge status: Home / Self Care | Payer: Medicare Other | Source: Ambulatory Visit | Attending: Obstetrics & Gynecology | Admitting: Obstetrics & Gynecology

## 2014-08-02 ENCOUNTER — Inpatient Hospital Stay (HOSPITAL_COMMUNITY): Payer: Medicare Other

## 2014-08-02 DIAGNOSIS — R109 Unspecified abdominal pain: Secondary | ICD-10-CM | POA: Insufficient documentation

## 2014-08-02 DIAGNOSIS — D259 Leiomyoma of uterus, unspecified: Secondary | ICD-10-CM | POA: Diagnosis not present

## 2014-08-02 DIAGNOSIS — M797 Fibromyalgia: Secondary | ICD-10-CM | POA: Diagnosis not present

## 2014-08-02 DIAGNOSIS — R102 Pelvic and perineal pain: Secondary | ICD-10-CM

## 2014-08-02 DIAGNOSIS — N939 Abnormal uterine and vaginal bleeding, unspecified: Secondary | ICD-10-CM | POA: Diagnosis not present

## 2014-08-02 DIAGNOSIS — N946 Dysmenorrhea, unspecified: Secondary | ICD-10-CM | POA: Insufficient documentation

## 2014-08-02 LAB — URINALYSIS, ROUTINE W REFLEX MICROSCOPIC
Bilirubin Urine: NEGATIVE
GLUCOSE, UA: NEGATIVE mg/dL
Hgb urine dipstick: NEGATIVE
KETONES UR: NEGATIVE mg/dL
Leukocytes, UA: NEGATIVE
NITRITE: NEGATIVE
PH: 6 (ref 5.0–8.0)
Protein, ur: NEGATIVE mg/dL
Specific Gravity, Urine: 1.015 (ref 1.005–1.030)
UROBILINOGEN UA: 0.2 mg/dL (ref 0.0–1.0)

## 2014-08-02 LAB — WET PREP, GENITAL
Clue Cells Wet Prep HPF POC: NONE SEEN
Trich, Wet Prep: NONE SEEN
WBC, Wet Prep HPF POC: NONE SEEN
Yeast Wet Prep HPF POC: NONE SEEN

## 2014-08-02 LAB — POCT PREGNANCY, URINE: PREG TEST UR: NEGATIVE

## 2014-08-02 MED ORDER — KETOROLAC TROMETHAMINE 60 MG/2ML IM SOLN: 60.0000 mg | Freq: Once | INTRAMUSCULAR | Status: DC

## 2014-08-02 NOTE — MAU Note (Signed)
Pt presents to MAU with complaints of abdominal pain yesterday. Denies any vaginal bleeding or discharge

## 2014-08-02 NOTE — MAU Provider Note (Signed)
History     CSN: [id number]  Arrival date and time: 08/02/14 1217   First Provider Initiated Contact with Patient 08/02/14 1307      Chief Complaint  Patient presents with  . Abdominal Pain   HPIpt is not pregnant and presents for vaginal pain with discharge- same partner for 7 mos- last had sex last week Pt has had a tubal ligation Pt also has mid abdominal pain radiating to her back- bilateral- pt states pain is worse after intercourse- pt has noticed pain mostly over past week Pt denies burning with urination, nausea/vomiting Pt has periods 2 times month lasting 4 days with bad cramps- no clots LMP 07/26/2014 with PMP 09/03/2014 Pt takes ibuprofen 800mg  per Dr. Darliss Ridgel for dysmenorrhea Pt has fibromyalgia Seizures- Dr.Crystal Rose(last seizure 2 years ago- started 6 years ago) Fibromyalgia- Dr. Erling Cruz in [place] Pain level is 4/10- at worst is 8/10   Signed Nursing MAU Note 08/02/2014 12:28 PM    Expand All Collapse All   Pt presents to MAU with complaints of abdominal pain yesterday. Denies any vaginal bleeding or discharge       Past Medical History  Diagnosis Date  . Fibromyalgia   . Seizures   . Asthma     Past Surgical History  Procedure Laterality Date  . Heel spur resect w/plantar fasciotomy Right 09-22-03    Right foot  . Neurectomy foot Right 01/26/04    Right foot, Plantar calc.  . Tarsal tunnel release Right 01/26/04    Right foot  . Neuroplasty digital n. Right 07/02/07    Right x 3  . Tarsal tunnel release Right 07/02/07    Right foot    History reviewed. No pertinent family history.  History  Substance Use Topics  . Smoking status: Never Smoker   . Smokeless tobacco: Never Used  . Alcohol Use: No    Allergies: No Known Allergies  Prescriptions prior to admission  Medication Sig Dispense Refill Last Dose  . amitriptyline (ELAVIL) 50 MG tablet Take 50 mg by mouth at bedtime.   Taking  . diclofenac (VOLTAREN) 50 MG EC tablet Take 1  tablet (50 mg total) by mouth 2 (two) times daily as needed. 60 tablet 0 Taking  . DIMETHICONE, TOPICAL, 2 % CREA Apply 1 application topically 2 (two) times daily. 30 g 2 Taking  . fluticasone (FLONASE) 50 MCG/ACT nasal spray    Taking  . fluticasone (FLOVENT HFA) 220 MCG/ACT inhaler Inhale 1 puff into the lungs 2 (two) times daily.   Taking  . Fluticasone-Salmeterol (ADVAIR) 250-50 MCG/DOSE AEPB Inhale 1 puff into the lungs every 12 (twelve) hours.   Taking  . gabapentin (NEURONTIN) 300 MG capsule Take 600 mg by mouth 2 (two) times daily.   Taking  . HYDROcodone-acetaminophen (NORCO) 7.5-325 MG per tablet Take 1 tablet by mouth every 6 (six) hours as needed for moderate pain. 60 tablet 0   . levETIRAcetam (KEPPRA) 500 MG tablet Take 500 mg by mouth 2 (two) times daily.   Taking  . rOPINIRole (REQUIP) 0.25 MG tablet Take 0.25 mg by mouth every evening.   Taking  . tiZANidine (ZANAFLEX) 4 MG tablet Take 4 mg by mouth every 6 (six) hours as needed. For muscle spasms   Taking  . zolpidem (AMBIEN) 10 MG tablet Take 10 mg by mouth at bedtime as needed. For sleep   Taking    Review of Systems  Constitutional: Negative for fever and chills.  Gastrointestinal: Positive for  abdominal pain. Negative for nausea, vomiting, diarrhea and constipation.  Genitourinary: Negative for dysuria and urgency.  Musculoskeletal: Positive for back pain.   Physical Exam   Blood pressure 130/99, pulse 76, temperature 97.9 F (36.6 C), temperature source Oral, resp. rate 18, last menstrual period 07/26/2014.  Physical Exam  Nursing note and vitals reviewed. Constitutional: She is oriented to person, place, and time. She appears well-developed and well-nourished. No distress.  HENT:  Head: Normocephalic.  Eyes: Pupils are equal, round, and reactive to light.  Neck: Normal range of motion. Neck supple.  Cardiovascular: Normal rate.   Respiratory: Effort normal.  GI: Soft. There is tenderness. There is no rebound  and no guarding.  Genitourinary:  Mod amount of white thin discharge in vault; cervix NT; uterus slightly irregular ant; right adnexa tender with palpation- no rebound- difficult to fully assess adnexa due to pt's discomfort  Musculoskeletal: Normal range of motion.  Neurological: She is alert and oriented to person, place, and time.  Skin: Skin is warm and dry.  Psychiatric: She has a normal mood and affect.    MAU Course  Procedures Pt refused Toradol injection for pain-pt anxious about having injection Results for orders placed or performed during the hospital encounter of 08/02/14 (from the past 24 hour(s))  Urinalysis, Routine w reflex microscopic     Status: None   Collection Time: 08/02/14 12:30 PM  Result Value Ref Range   Color, Urine YELLOW YELLOW   APPearance CLEAR CLEAR   Specific Gravity, Urine 1.015 1.005 - 1.030   pH 6.0 5.0 - 8.0   Glucose, UA NEGATIVE NEGATIVE mg/dL   Hgb urine dipstick NEGATIVE NEGATIVE   Bilirubin Urine NEGATIVE NEGATIVE   Ketones, ur NEGATIVE NEGATIVE mg/dL   Protein, ur NEGATIVE NEGATIVE mg/dL   Urobilinogen, UA 0.2 0.0 - 1.0 mg/dL   Nitrite NEGATIVE NEGATIVE   Leukocytes, UA NEGATIVE NEGATIVE  Pregnancy, urine POC     Status: None   Collection Time: 08/02/14 12:54 PM  Result Value Ref Range   Preg Test, Ur NEGATIVE NEGATIVE  Wet prep, genital     Status: None   Collection Time: 08/02/14  1:27 PM  Result Value Ref Range   Yeast Wet Prep HPF POC NONE SEEN NONE SEEN   Trich, Wet Prep NONE SEEN NONE SEEN   Clue Cells Wet Prep HPF POC NONE SEEN NONE SEEN   WBC, Wet Prep HPF POC NONE SEEN NONE SEEN  GC/Chlamydia culture pending HIV/RPR refused- did not want needle stick- pt said she was tested last year [place]- preliminary report-showed right uterine fibroid; left and right ovary not visualized by transabdominal; transvaginal exam terminated due to pt's discomfort Encouraged pt to take Ibuprofen/Tylenol and heating pain and to follow up with [facility]  outpatient clinic for further evaluation- note sent to clinic to call pt with appointment Pt aware that exam was limited today- pt to return if increase in pain or bleeding Pt to keep track of bleeding with APP on phone to report when seen in GYN clinic Encouraged pt to use lubricant with sex- could take NSAID before sex Assessment and Plan  Abdominal pain abnormal uterine bleeding F/u with GYN clinic  [facility] 08/02/2014, 1:08 PM

## 2014-08-04 LAB — GC/CHLAMYDIA PROBE AMP (~~LOC~~) NOT AT ARMC
Chlamydia: NEGATIVE
Neisseria Gonorrhea: NEGATIVE

## 2014-08-11 ENCOUNTER — Ambulatory Visit (INDEPENDENT_AMBULATORY_CARE_PROVIDER_SITE_OTHER): Payer: Medicare Other | Admitting: Family Medicine

## 2014-08-11 ENCOUNTER — Encounter: Payer: Self-pay | Admitting: Family Medicine

## 2014-08-11 VITALS — BP 123/90 | HR 69 | Ht 59.0 in | Wt 150.9 lb

## 2014-08-11 DIAGNOSIS — N926 Irregular menstruation, unspecified: Secondary | ICD-10-CM | POA: Diagnosis not present

## 2014-08-11 MED ORDER — MEGESTROL ACETATE 40 MG PO TABS: 40.0000 mg | ORAL_TABLET | Freq: Every day | ORAL | Status: DC

## 2014-08-11 NOTE — Progress Notes (Signed)
   Subjective:    Patient ID: Isabel Shaw, female    DOB: [birth year], 44 y.o.   MRN: [id number]  HPI This is a 44 year old G5 P0 414 who presents to the clinic with irregular menses. The patient has been having moderate to heavy bleeding for 4-5 days every other week for the past 2 years. Patient was seen in the MAU for this irregular bleeding, as well as pelvic pain. The pelvic pain is resolved. The bleeding has no provoking or palliating factors. She had an ultrasound performed in the MAU which showed a fibroid that measured 1.4 x 1.1 x 1 cm that was intramural in the right posterior fundal aspect of the uterus. Her endometrial thickness was 7.5 mm.   Review of Systems  Constitutional: Negative for fever, chills and fatigue.  Respiratory: Negative for shortness of breath.   Cardiovascular: Negative for chest pain.  Endocrine: Negative for cold intolerance, heat intolerance, polydipsia, polyphagia and polyuria.  Genitourinary: Negative for urgency, hematuria, decreased urine volume, vaginal discharge, vaginal pain and pelvic pain.       Objective:   Physical Exam  Constitutional: She is oriented to person, place, and time. She appears well-developed and well-nourished.  Cardiovascular: Normal rate, regular rhythm and normal heart sounds.   Pulmonary/Chest: Effort normal and breath sounds normal.  Abdominal: Soft. Bowel sounds are normal. She exhibits no distension and no mass. There is no tenderness. There is no rebound and no guarding.  Musculoskeletal: She exhibits no edema.  Neurological: She is alert and oriented to person, place, and time.  Skin: Skin is warm and dry.  Psychiatric: She has a normal mood and affect. Her behavior is normal. Judgment and thought content normal.      Assessment & Plan:   Problem List Items Addressed This Visit    None    Visit Diagnoses    Irregular bleeding    -  Primary    Relevant Orders    TSH    Hemoglobin A1c      We'll check TSH  and hemoglobin A1c, although low likelihood as to the etiology of the patient's irregular bleeding. Her bleeding is likely related to the fibroid. With her endometrial stripe being 7.5 mm, I do not feel that endometrial biopsy would be of high yield, unless she were to failed medical treatment. Discussed treatment options with the patient, including oral medications, injections, IUD, and a mutual ablation, hysterectomy. Patient would like to try oral medications first. We'll start the patient on Megace. Patient to follow-up in 4-6 months. Will call patient with results of tests.

## 2014-08-11 NOTE — Progress Notes (Signed)
Pt reports having two periods per month for the past two years. Pt not sure she can tolerate endo bx. Would like to discuss further with provider.

## 2014-08-12 LAB — TSH: TSH: 0.803 u[IU]/mL (ref 0.350–4.500)

## 2014-08-12 LAB — HEMOGLOBIN A1C
HEMOGLOBIN A1C: 5.4 % (ref <5.7)
Mean Plasma Glucose: 108 mg/dL (ref <117)

## 2014-08-14 ENCOUNTER — Telehealth: Payer: Self-pay

## 2014-08-14 NOTE — Telephone Encounter (Signed)
-----   Message from Truett Mainland, DO sent at 08/14/2014  2:32 PM EDT ----- Please let pt know that labs are normal

## 2014-08-14 NOTE — Telephone Encounter (Signed)
Called patient and informed her of normal labs. Patient verbalized understanding and gratitude. No questions or concerns.

## 2014-08-29 ENCOUNTER — Ambulatory Visit (INDEPENDENT_AMBULATORY_CARE_PROVIDER_SITE_OTHER): Payer: Medicare Other | Admitting: Podiatry

## 2014-08-29 ENCOUNTER — Encounter: Payer: Self-pay | Admitting: Podiatry

## 2014-08-29 VITALS — BP 159/97 | HR 70

## 2014-08-29 DIAGNOSIS — M722 Plantar fascial fibromatosis: Secondary | ICD-10-CM | POA: Diagnosis not present

## 2014-08-29 DIAGNOSIS — M21962 Unspecified acquired deformity of left lower leg: Secondary | ICD-10-CM

## 2014-08-29 MED ORDER — HYDROCODONE-ACETAMINOPHEN 7.5-325 MG PO TABS: 1.0000 | ORAL_TABLET | Freq: Four times a day (QID) | ORAL | Status: DC | PRN

## 2014-08-29 NOTE — Progress Notes (Signed)
Subjective: Been to work wearing Tennis shoes. Arch of both feet hurt and feels like walking on bone. Using an arch support, Dr. Zoe Lan.  On feet 4 hours/day and 3 days/week. Taking Neurontin for Fibromyalgia.  Stated that she needs pain pill at night.   Objective: Dermatologic: Old eschar on right medial ankle from Tarsal tunnel surgery. Neurologic: Vascular:  Orthopedic:  Assessment: Elevated first ray bilateral. Plantar fasciitis.  Plan: Pain pill renewed.

## 2014-08-29 NOTE — Patient Instructions (Signed)
Seen for pain on both arches. Need good arch supports. Pain medication renewed.  Return as needed.

## 2014-09-03 ENCOUNTER — Encounter: Payer: Medicare Other | Admitting: Family Medicine

## 2014-10-06 DIAGNOSIS — D259 Leiomyoma of uterus, unspecified: Secondary | ICD-10-CM | POA: Diagnosis not present

## 2014-10-06 DIAGNOSIS — J4520 Mild intermittent asthma, uncomplicated: Secondary | ICD-10-CM | POA: Diagnosis not present

## 2014-10-06 DIAGNOSIS — E669 Obesity, unspecified: Secondary | ICD-10-CM | POA: Diagnosis not present

## 2014-10-06 DIAGNOSIS — E559 Vitamin D deficiency, unspecified: Secondary | ICD-10-CM | POA: Diagnosis not present

## 2014-10-06 DIAGNOSIS — R569 Unspecified convulsions: Secondary | ICD-10-CM | POA: Diagnosis not present

## 2014-10-21 ENCOUNTER — Telehealth: Payer: Self-pay | Admitting: *Deleted

## 2014-10-21 MED ORDER — HYDROCODONE-ACETAMINOPHEN 7.5-325 MG PO TABS: 1.0000 | ORAL_TABLET | Freq: Four times a day (QID) | ORAL | Status: DC | PRN

## 2014-10-21 NOTE — Telephone Encounter (Signed)
Pt called and requested pain med refill

## 2014-11-21 DIAGNOSIS — G43019 Migraine without aura, intractable, without status migrainosus: Secondary | ICD-10-CM | POA: Diagnosis not present

## 2014-11-21 DIAGNOSIS — G4709 Other insomnia: Secondary | ICD-10-CM | POA: Diagnosis not present

## 2014-11-21 DIAGNOSIS — G40309 Generalized idiopathic epilepsy and epileptic syndromes, not intractable, without status epilepticus: Secondary | ICD-10-CM | POA: Diagnosis not present

## 2014-11-21 DIAGNOSIS — G2581 Restless legs syndrome: Secondary | ICD-10-CM | POA: Diagnosis not present

## 2014-11-27 DIAGNOSIS — Z1231 Encounter for screening mammogram for malignant neoplasm of breast: Secondary | ICD-10-CM | POA: Diagnosis not present

## 2014-12-02 DIAGNOSIS — N6001 Solitary cyst of right breast: Secondary | ICD-10-CM | POA: Diagnosis not present

## 2015-01-05 DIAGNOSIS — J302 Other seasonal allergic rhinitis: Secondary | ICD-10-CM | POA: Diagnosis not present

## 2015-01-05 DIAGNOSIS — D259 Leiomyoma of uterus, unspecified: Secondary | ICD-10-CM | POA: Diagnosis not present

## 2015-01-05 DIAGNOSIS — M609 Myositis, unspecified: Secondary | ICD-10-CM | POA: Diagnosis not present

## 2015-01-05 DIAGNOSIS — J4520 Mild intermittent asthma, uncomplicated: Secondary | ICD-10-CM | POA: Diagnosis not present

## 2015-01-05 DIAGNOSIS — E669 Obesity, unspecified: Secondary | ICD-10-CM | POA: Diagnosis not present

## 2015-02-12 ENCOUNTER — Ambulatory Visit (INDEPENDENT_AMBULATORY_CARE_PROVIDER_SITE_OTHER): Payer: Medicare Other | Admitting: Family Medicine

## 2015-02-12 ENCOUNTER — Encounter: Payer: Self-pay | Admitting: Family Medicine

## 2015-02-12 VITALS — BP 134/97 | HR 77 | Temp 98.5°F | Ht 59.0 in | Wt 168.3 lb

## 2015-02-12 DIAGNOSIS — N926 Irregular menstruation, unspecified: Secondary | ICD-10-CM

## 2015-02-12 MED ORDER — MEGESTROL ACETATE 40 MG PO TABS: 40.0000 mg | ORAL_TABLET | Freq: Two times a day (BID) | ORAL | Status: DC

## 2015-02-12 NOTE — Progress Notes (Signed)
   Subjective:    Patient ID: Isabel Shaw, female    DOB: Mar 21, 1971, 44 y.o.   MRN: [id number]  HPI Patient seen for 6 month follow up from [facility].  Bleeding is a lot less than before, although not completely controlled.  Still has bleeding about half the days in a week.  Changes pad about 3 times daily. Still has significant cramping.    Review of Systems  Constitutional: Negative for fever, chills and fatigue.  Gastrointestinal: Negative for nausea, vomiting, abdominal pain and diarrhea.  Genitourinary: Negative for decreased urine volume, vaginal discharge, vaginal pain and dyspareunia.       Objective:   Physical Exam  Constitutional: She is oriented to person, place, and time. She appears well-developed and well-nourished.  Neck: Normal range of motion. Neck supple. No thyromegaly present.  Cardiovascular: Normal rate, regular rhythm and normal heart sounds.   Pulmonary/Chest: Effort normal and breath sounds normal.  Abdominal: Soft. Bowel sounds are normal. She exhibits no distension and no mass. There is no tenderness. There is no rebound and no guarding.  Neurological: She is alert and oriented to person, place, and time.  Skin: Skin is warm and dry.      Assessment & Plan:  1. Irregular bleeding Discussed IUD vs continuing megace.  Patient prefers to stay on megace.  Increase megace to 40mg  BID.  Patient to call in 2 weeks if not controlled.  F/u 6 months.

## 2015-02-16 ENCOUNTER — Telehealth: Payer: Self-pay | Admitting: Family Medicine

## 2015-02-16 ENCOUNTER — Other Ambulatory Visit: Payer: Self-pay | Admitting: Family Medicine

## 2015-02-16 MED ORDER — MEGESTROL ACETATE 40 MG PO TABS: 40.0000 mg | ORAL_TABLET | Freq: Two times a day (BID) | ORAL | Status: DC

## 2015-02-16 NOTE — Telephone Encounter (Signed)
Opened in error

## 2015-02-24 ENCOUNTER — Encounter: Payer: Self-pay | Admitting: Podiatry

## 2015-02-24 ENCOUNTER — Ambulatory Visit (INDEPENDENT_AMBULATORY_CARE_PROVIDER_SITE_OTHER): Payer: Medicare Other | Admitting: Podiatry

## 2015-02-24 VITALS — BP 152/95 | HR 75

## 2015-02-24 DIAGNOSIS — M722 Plantar fascial fibromatosis: Secondary | ICD-10-CM | POA: Insufficient documentation

## 2015-02-24 DIAGNOSIS — M21962 Unspecified acquired deformity of left lower leg: Secondary | ICD-10-CM

## 2015-02-24 DIAGNOSIS — M25579 Pain in unspecified ankle and joints of unspecified foot: Secondary | ICD-10-CM | POA: Diagnosis not present

## 2015-02-24 MED ORDER — HYDROCODONE-ACETAMINOPHEN 7.5-325 MG PO TABS: 1.0000 | ORAL_TABLET | Freq: Four times a day (QID) | ORAL | Status: DC | PRN

## 2015-02-24 NOTE — Patient Instructions (Signed)
Seen for pain and swelling in both feet.  Pain medication prescribed as requested.

## 2015-02-24 NOTE — Progress Notes (Signed)
Subjective: 44 year old female presents complaining of bilateral foot pain and swelling.  Was diagnosed with Uterine fibroid and been treated with steroid that resulted in gaining weight, about 20 lbs.  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 Having pain and swollen foot bilateral starting last months.  Taking Neurontin for Fibromyalgia.   Stated that she needs pain pill at night.   Objective: Dermatologic: Old eschar on right medial ankle from Tarsal tunnel surgery. Neurologic: Failed to respond to Monofilament sensory testing on right. Normal response on left.  Vascular: All pedal pulses are palpable bilateral. No edema or erythema bilateral.  Orthopedic: Bilateral bunion deformity.   Assessment: Elevated first ray bilateral. Plantar fasciitis bilateral.  Right ankle pain at Tarsal tunnel area right.   Plan: Pain prescription renewed

## 2015-04-06 DIAGNOSIS — D259 Leiomyoma of uterus, unspecified: Secondary | ICD-10-CM | POA: Diagnosis not present

## 2015-04-06 DIAGNOSIS — Z6834 Body mass index (BMI) 34.0-34.9, adult: Secondary | ICD-10-CM | POA: Diagnosis not present

## 2015-04-06 DIAGNOSIS — E559 Vitamin D deficiency, unspecified: Secondary | ICD-10-CM | POA: Diagnosis not present

## 2015-04-06 DIAGNOSIS — E669 Obesity, unspecified: Secondary | ICD-10-CM | POA: Diagnosis not present

## 2015-04-06 DIAGNOSIS — J4520 Mild intermittent asthma, uncomplicated: Secondary | ICD-10-CM | POA: Diagnosis not present

## 2015-05-19 ENCOUNTER — Encounter (HOSPITAL_COMMUNITY): Payer: Self-pay | Admitting: *Deleted

## 2015-05-19 DIAGNOSIS — M797 Fibromyalgia: Secondary | ICD-10-CM | POA: Insufficient documentation

## 2015-05-19 DIAGNOSIS — J45901 Unspecified asthma with (acute) exacerbation: Secondary | ICD-10-CM | POA: Insufficient documentation

## 2015-05-19 DIAGNOSIS — Z79899 Other long term (current) drug therapy: Secondary | ICD-10-CM | POA: Diagnosis not present

## 2015-05-19 DIAGNOSIS — J069 Acute upper respiratory infection, unspecified: Secondary | ICD-10-CM | POA: Insufficient documentation

## 2015-05-19 DIAGNOSIS — R05 Cough: Secondary | ICD-10-CM | POA: Diagnosis present

## 2015-05-19 DIAGNOSIS — Z7951 Long term (current) use of inhaled steroids: Secondary | ICD-10-CM | POA: Insufficient documentation

## 2015-05-19 NOTE — ED Notes (Signed)
The pt has had a cold and a cough for 3 days.  Her cough is getting worse.  Hx of asthma no distress no wheezing  lmp 12 months ago

## 2015-05-20 ENCOUNTER — Emergency Department (HOSPITAL_COMMUNITY)
Admission: EM | Admit: 2015-05-20 | Discharge: 2015-05-20 | Disposition: A | Discharge status: Home / Self Care | Payer: Medicare Other | Attending: Emergency Medicine | Admitting: Emergency Medicine

## 2015-05-20 ENCOUNTER — Emergency Department (HOSPITAL_COMMUNITY): Payer: Medicare Other

## 2015-05-20 DIAGNOSIS — J069 Acute upper respiratory infection, unspecified: Secondary | ICD-10-CM

## 2015-05-20 MED ORDER — BENZONATATE 100 MG PO CAPS: 100.0000 mg | ORAL_CAPSULE | Freq: Three times a day (TID) | ORAL | Status: DC

## 2015-05-20 MED ORDER — DEXAMETHASONE 4 MG PO TABS
10.0000 mg | ORAL_TABLET | Freq: Once | ORAL | Status: AC
  Administered 2015-05-20: 10 mg via ORAL
  Filled 2015-05-20: qty 3

## 2015-05-20 NOTE — Discharge Instructions (Signed)

## 2015-05-20 NOTE — ED Provider Notes (Signed)
CSN: [id number]     Arrival date & time 05/19/15  2233 History  By signing my name below, I, Soijett Blue, attest that this documentation has been prepared under the direction and in the presence of Deno Etienne, DO. Electronically Signed: Soijett Blue, ED Scribe. 05/20/2015. 3:35 AM.   Chief Complaint  Patient presents with  . Cough      The history is provided by the patient. No language interpreter was used.    Isabel Shaw is a 45 y.o. female with a PMHx of asthma who presents to the Emergency Department complaining of gradually worsening cough onset  2 days. She notes that she feels like she has to use her inhaler more often and she has positive sick contacts of her grandchildren. She states that she is having associated symptoms of wheezing and CP due to cough. She states that she has not tried any medications for the  relief for her symptoms. She denies facial pain and any other symptoms.   Past Medical History  Diagnosis Date  . Fibromyalgia   . Seizures ([place])   . Asthma    Past Surgical History  Procedure Laterality Date  . Heel spur resect w/plantar fasciotomy Right 09-22-03    Right foot  . Neurectomy foot Right 01/26/04    Right foot, Plantar calc.  . Tarsal tunnel release Right 01/26/04    Right foot  . Neuroplasty digital n. Right 07/02/07    Right x 3  . Tarsal tunnel release Right 07/02/07    Right foot   No family history on file. Social History  Substance Use Topics  . Smoking status: Never Smoker   . Smokeless tobacco: Never Used  . Alcohol Use: No   OB History    Gravida Para Term Preterm AB TAB SAB Ectopic Multiple Living   5 4  4 1  1   4      Review of Systems  Constitutional: Negative for fever and chills.  HENT: Negative for congestion and rhinorrhea.   Eyes: Negative for redness and visual disturbance.  Respiratory: Positive for cough and wheezing. Negative for shortness of breath.   Cardiovascular: Positive for chest pain (due to cough).  Negative for palpitations.  Gastrointestinal: Negative for nausea and vomiting.  Genitourinary: Negative for dysuria and urgency.  Musculoskeletal: Negative for myalgias and arthralgias.  Skin: Negative for pallor and wound.  Neurological: Negative for dizziness and headaches.      Allergies  Review of patient's allergies indicates no known allergies.  Home Medications   Prior to Admission medications   Medication Sig Start Date End Date Taking? Authorizing Provider  albuterol (PROVENTIL HFA;VENTOLIN HFA) 108 (90 BASE) MCG/ACT inhaler Inhale 2 puffs into the lungs every 6 (six) hours as needed for wheezing or shortness of breath.   Yes Historical Provider, MD  amitriptyline (ELAVIL) 50 MG tablet Take 50 mg by mouth at bedtime.   Yes Historical Provider, MD  fluticasone (FLONASE) 50 MCG/ACT nasal spray Place 1 spray into both nostrils daily.  07/22/13  Yes Historical Provider, MD  Fluticasone-Salmeterol (ADVAIR) 250-50 MCG/DOSE AEPB Inhale 1 puff into the lungs every 12 (twelve) hours.   Yes Historical Provider, MD  gabapentin (NEURONTIN) 300 MG capsule Take 600 mg by mouth 2 (two) times daily.   Yes Historical Provider, MD  levETIRAcetam (KEPPRA) 500 MG tablet Take 500 mg by mouth 2 (two) times daily.   Yes Historical Provider, MD  loratadine (CLARITIN) 10 MG tablet Take 10 mg by mouth  at bedtime.   Yes Historical Provider, MD  megestrol (MEGACE) 40 MG tablet Take 1 tablet (40 mg total) by mouth 2 (two) times daily. 02/16/15  Yes Tanna Savoy Stinson, DO  rOPINIRole (REQUIP) 0.25 MG tablet Take 0.25 mg by mouth every evening.   Yes Historical Provider, MD  benzonatate (TESSALON) 100 MG capsule Take 1 capsule (100 mg total) by mouth every 8 (eight) hours. 05/20/15   Deno Etienne, DO  diclofenac (VOLTAREN) 50 MG EC tablet Take 1 tablet (50 mg total) by mouth 2 (two) times daily as needed. Patient not taking: Reported on 02/12/2015 10/10/13   Gregor Hams, MD  DIMETHICONE, TOPICAL, 2 % CREA Apply 1  application topically 2 (two) times daily. Patient not taking: Reported on 02/12/2015 09/17/13   Myeong O Sheard, DPM  HYDROcodone-acetaminophen (NORCO) 7.5-325 MG tablet Take 1 tablet by mouth every 6 (six) hours as needed for moderate pain. Patient not taking: Reported on 05/20/2015 02/24/15   Myeong O Sheard, DPM   BP 150/102 mmHg  Pulse 71  Temp(Src) 98.9 F (37.2 C) (Oral)  Resp 17  SpO2 100%  LMP 03/19/2015 Physical Exam  Constitutional: She is oriented to person, place, and time. She appears well-developed and well-nourished. No distress.  HENT:  Head: Normocephalic and atraumatic.  Right Ear: External ear normal.  Left Ear: External ear normal.  Mouth/Throat: Oropharynx is clear and moist and mucous membranes are normal.  Swollen turbinates. Bilateral TMs obscured by wax  Eyes: EOM are normal. Pupils are equal, round, and reactive to light.  Neck: Normal range of motion. Neck supple.  Cardiovascular: Normal rate, regular rhythm and normal heart sounds.  Exam reveals no gallop and no friction rub.   No murmur heard. Pulmonary/Chest: Effort normal and breath sounds normal. She has no wheezes. She has no rales.  Lungs clear to ausculation bilaterally.   Abdominal: Soft. She exhibits no distension. There is no tenderness. There is no rebound.  Musculoskeletal: She exhibits no edema or tenderness.  Neurological: She is alert and oriented to person, place, and time.  Skin: Skin is warm and dry. She is not diaphoretic.  Psychiatric: She has a normal mood and affect. Her behavior is normal.  Nursing note and vitals reviewed.   ED Course  Procedures (including critical care time) DIAGNOSTIC STUDIES: Oxygen Saturation is 100% on RA, nl by my interpretation.    COORDINATION OF CARE: 3:34 AM Discussed treatment plan with pt at bedside which includes CXR and steroids  and pt agreed to plan.    Labs Review Labs Reviewed - No data to display  Imaging Review Dg Chest 2  View  05/20/2015  CLINICAL DATA:  Chest pain and shortness of breath for 2 days. Cough. EXAM: CHEST  2 VIEW COMPARISON:  11/20/2009 FINDINGS: The cardiomediastinal contours are normal. The lungs are clear. Pulmonary vasculature is normal. No consolidation, pleural effusion, or pneumothorax. No acute osseous abnormalities are seen. IMPRESSION: No acute pulmonary process. Electronically Signed   By: Jeb Levering M.D.   On: 05/20/2015 02:26   I have personally reviewed and evaluated these images as part of my medical decision-making.   EKG Interpretation None      MDM   Final diagnoses:  URI (upper respiratory infection)    45 yo F with a chief complaint of cough and myalgias. This been going on for the past couple days. Patient felt like she had some mild shortness of breath such came for evaluation. Chest x-ray is negative for  pneumonia. We'll discharge the patient home with cough medicine.   I have discussed the diagnosis/risks/treatment options with the patient and family and believe the pt to be eligible for discharge home to follow-up with PCP. We also discussed returning to the ED immediately if new or worsening sx occur. We discussed the sx which are most concerning (e.g., sudden worsening pain, fever, inability to tolerate by mouth ) that necessitate immediate return. Medications administered to the patient during their visit and any new prescriptions provided to the patient are listed below.  Medications given during this visit Medications  dexamethasone (DECADRON) tablet 10 mg (10 mg Oral Given 05/20/15 0340)    Discharge Medication List as of 05/20/2015  3:41 AM    START taking these medications   Details  benzonatate (TESSALON) 100 MG capsule Take 1 capsule (100 mg total) by mouth every 8 (eight) hours., Starting 05/20/2015, Until Discontinued, Print        The patient appears reasonably screen and/or stabilized for discharge and I doubt any other medical condition or other  [facility] requiring further screening, evaluation, or treatment in the ED at this time prior to discharge.     I personally performed the services described in this documentation, which was scribed in my presence. The recorded information has been reviewed and is accurate.     Deno Etienne, DO 05/20/15 [phone]

## 2015-06-12 ENCOUNTER — Encounter (HOSPITAL_COMMUNITY): Payer: Self-pay | Admitting: *Deleted

## 2015-06-12 ENCOUNTER — Emergency Department (HOSPITAL_COMMUNITY)
Admission: EM | Admit: 2015-06-12 | Discharge: 2015-06-12 | Disposition: A | Discharge status: Home / Self Care | Payer: Medicare Other | Attending: Emergency Medicine | Admitting: Emergency Medicine

## 2015-06-12 ENCOUNTER — Emergency Department (HOSPITAL_COMMUNITY): Payer: Medicare Other

## 2015-06-12 DIAGNOSIS — R0600 Dyspnea, unspecified: Secondary | ICD-10-CM | POA: Diagnosis not present

## 2015-06-12 DIAGNOSIS — Z79899 Other long term (current) drug therapy: Secondary | ICD-10-CM | POA: Diagnosis not present

## 2015-06-12 DIAGNOSIS — G40909 Epilepsy, unspecified, not intractable, without status epilepticus: Secondary | ICD-10-CM | POA: Diagnosis not present

## 2015-06-12 DIAGNOSIS — E010 Iodine-deficiency related diffuse (endemic) goiter: Secondary | ICD-10-CM | POA: Diagnosis not present

## 2015-06-12 DIAGNOSIS — R0602 Shortness of breath: Secondary | ICD-10-CM | POA: Diagnosis present

## 2015-06-12 DIAGNOSIS — Z7951 Long term (current) use of inhaled steroids: Secondary | ICD-10-CM | POA: Insufficient documentation

## 2015-06-12 DIAGNOSIS — Z8739 Personal history of other diseases of the musculoskeletal system and connective tissue: Secondary | ICD-10-CM | POA: Diagnosis not present

## 2015-06-12 DIAGNOSIS — J45909 Unspecified asthma, uncomplicated: Secondary | ICD-10-CM | POA: Insufficient documentation

## 2015-06-12 LAB — BASIC METABOLIC PANEL
Anion gap: 10 (ref 5–15)
BUN: 6 mg/dL (ref 6–20)
CALCIUM: 8.9 mg/dL (ref 8.9–10.3)
CO2: 23 mmol/L (ref 22–32)
CREATININE: 0.81 mg/dL (ref 0.44–1.00)
Chloride: 105 mmol/L (ref 101–111)
GFR calc non Af Amer: >60 mL/min (ref >60)
GLUCOSE: 98 mg/dL (ref 65–99)
Potassium: 3 mmol/L — ABNORMAL LOW (ref 3.5–5.1)
Sodium: 138 mmol/L (ref 135–145)

## 2015-06-12 LAB — CBC
HEMATOCRIT: 36.8 % (ref 36.0–46.0)
Hemoglobin: 12.2 g/dL (ref 12.0–15.0)
MCH: 25.3 pg — ABNORMAL LOW (ref 26.0–34.0)
MCHC: 33.2 g/dL (ref 30.0–36.0)
MCV: 76.3 fL — ABNORMAL LOW (ref 78.0–100.0)
Platelets: 292 10*3/uL (ref 150–400)
RBC: 4.82 MIL/uL (ref 3.87–5.11)
RDW: 15.7 % — AB (ref 11.5–15.5)
WBC: 4.6 10*3/uL (ref 4.0–10.5)

## 2015-06-12 LAB — BRAIN NATRIURETIC PEPTIDE: B Natriuretic Peptide: 27.9 pg/mL (ref 0.0–100.0)

## 2015-06-12 LAB — D-DIMER, QUANTITATIVE (NOT AT ARMC): D DIMER QUANT: 0.49 ug{FEU}/mL (ref 0.00–0.50)

## 2015-06-12 MED ORDER — BENZONATATE 100 MG PO CAPS: 100.0000 mg | ORAL_CAPSULE | Freq: Three times a day (TID) | ORAL | Status: DC

## 2015-06-12 NOTE — ED Provider Notes (Signed)
CSN: [id number]     Arrival date & time 06/12/15  1652 History   First MD Initiated Contact with Patient 06/12/15 1952     Chief Complaint  Patient presents with  . Shortness of Breath      HPI Patient ports some exertional shortness of breath and shortness of breath with lying flat over the past week.  She is currently being worked up for thyromegaly by her primary care physician.  She does not feel any significant swelling in her neck.  She denies shortness of breath at rest.  No difficulty swallowing.  No change in her voice.  No prior history of DVT or pulmonary embolism.  She denies active chest pain at this time.  She denies abdominal pain.  No fevers or chills.  Denies productive cough.   Past Medical History  Diagnosis Date  . Fibromyalgia   . Seizures ([place])   . Asthma    Past Surgical History  Procedure Laterality Date  . Heel spur resect w/plantar fasciotomy Right 09-22-03    Right foot  . Neurectomy foot Right 01/26/04    Right foot, Plantar calc.  . Tarsal tunnel release Right 01/26/04    Right foot  . Neuroplasty digital n. Right 07/02/07    Right x 3  . Tarsal tunnel release Right 07/02/07    Right foot   History reviewed. No pertinent family history. Social History  Substance Use Topics  . Smoking status: Never Smoker   . Smokeless tobacco: Never Used  . Alcohol Use: No   OB History    Gravida Para Term Preterm AB TAB SAB Ectopic Multiple Living   5 4  4 1  1   4      Review of Systems  All other systems reviewed and are negative.     Allergies  Review of patient's allergies indicates no known allergies.  Home Medications   Prior to Admission medications   Medication Sig Start Date End Date Taking? Authorizing Provider  albuterol (PROVENTIL HFA;VENTOLIN HFA) 108 (90 BASE) MCG/ACT inhaler Inhale 2 puffs into the lungs every 6 (six) hours as needed for wheezing or shortness of breath.   Yes Historical Provider, MD  amitriptyline (ELAVIL) 50 MG tablet  Take 50 mg by mouth at bedtime.   Yes Historical Provider, MD  fluticasone (FLONASE) 50 MCG/ACT nasal spray Place 1 spray into both nostrils daily.  07/22/13  Yes Historical Provider, MD  Fluticasone-Salmeterol (ADVAIR) 250-50 MCG/DOSE AEPB Inhale 1 puff into the lungs every 12 (twelve) hours.   Yes Historical Provider, MD  gabapentin (NEURONTIN) 300 MG capsule Take 600 mg by mouth 2 (two) times daily.   Yes Historical Provider, MD  HYDROcodone-acetaminophen (NORCO) 7.5-325 MG tablet Take 1 tablet by mouth every 6 (six) hours as needed for moderate pain. 02/24/15  Yes Myeong O Sheard, DPM  levETIRAcetam (KEPPRA) 500 MG tablet Take 500 mg by mouth 2 (two) times daily.   Yes Historical Provider, MD  loratadine (CLARITIN) 10 MG tablet Take 10 mg by mouth at bedtime.   Yes Historical Provider, MD  megestrol (MEGACE) 40 MG tablet Take 1 tablet (40 mg total) by mouth 2 (two) times daily. 02/16/15  Yes Tanna Savoy Stinson, DO  rOPINIRole (REQUIP) 0.25 MG tablet Take 0.25 mg by mouth every evening.   Yes Historical Provider, MD  benzonatate (TESSALON) 100 MG capsule Take 1 capsule (100 mg total) by mouth every 8 (eight) hours. 06/12/15   Jola Schmidt, MD  diclofenac (VOLTAREN) 50 MG  EC tablet Take 1 tablet (50 mg total) by mouth 2 (two) times daily as needed. Patient not taking: Reported on 02/12/2015 10/10/13   Gregor Hams, MD  DIMETHICONE, TOPICAL, 2 % CREA Apply 1 application topically 2 (two) times daily. Patient not taking: Reported on 02/12/2015 09/17/13   Myeong O Sheard, DPM   BP 152/102 mmHg  Pulse 74  Temp(Src) 98.5 F (36.9 C) (Oral)  Resp 14  Ht 4\' 11"  (1.499 m)  Wt 169 lb (76.658 kg)  BMI 34.12 kg/m2  SpO2 99%  LMP 03/19/2015 Physical Exam  Constitutional: She is oriented to person, place, and time. She appears well-developed and well-nourished. No distress.  HENT:  Head: Normocephalic and atraumatic.  Eyes: EOM are normal.  Neck: Normal range of motion. Neck supple. No tracheal deviation  present. Thyromegaly present.  Cardiovascular: Normal rate, regular rhythm and normal heart sounds.   Pulmonary/Chest: Effort normal and breath sounds normal. No stridor.  Abdominal: Soft. She exhibits no distension. There is no tenderness.  Musculoskeletal: Normal range of motion.  Neurological: She is alert and oriented to person, place, and time.  Skin: Skin is warm and dry.  Psychiatric: She has a normal mood and affect. Judgment normal.  Nursing note and vitals reviewed.   ED Course  Procedures (including critical care time) Labs Review Labs Reviewed  CBC - Abnormal; Notable for the following:    MCV 76.3 (*)    MCH 25.3 (*)    RDW 15.7 (*)    All other components within normal limits  BASIC METABOLIC PANEL - Abnormal; Notable for the following:    Potassium 3.0 (*)    All other components within normal limits  BRAIN NATRIURETIC PEPTIDE  D-DIMER, QUANTITATIVE (NOT AT [facility])    Imaging Review Dg Chest 2 View  06/12/2015  CLINICAL DATA:  Cough, shortness of breath, dry throat today. EXAM: CHEST  2 VIEW COMPARISON:  05/20/2015 FINDINGS: The heart size and mediastinal contours are within normal limits. Both lungs are clear. The visualized skeletal structures are unremarkable. IMPRESSION: No active cardiopulmonary disease. Electronically Signed   By: Lucienne Capers M.D.   On: 06/12/2015 18:11   I have personally reviewed and evaluated these images and lab results as part of my medical decision-making.   EKG Interpretation   Date/Time:  Friday June 12 2015 16:59:40 EST Ventricular Rate:  78 PR Interval:  140 QRS Duration: 90 QT Interval:  386 QTC Calculation: 440 R Axis:   23 Text Interpretation:  Normal sinus rhythm Minimal voltage criteria for  LVH, may be normal variant Possible Anterior infarct , age undetermined No  significant change was found Confirmed by Jeffory Snelgrove  MD, Lennette Bihari ([id number]) on  06/12/2015 8:04:13 PM      MDM   Final diagnoses:  Dyspnea   Thyromegaly    Patient is overall well-appearing.  No hypoxia.  Ambien around the emergency department without any difficulty.  No increased work of breathing.  Chest x-ray, labs, BMP, d-dimer without abnormality.  Discharge home in good condition.  Primary care follow-up.  She understands to continue her follow-up for her thyromegaly.  This may be causing some of her symptoms but I do not think it needs additional workup or management acutely tonight.    Jola Schmidt, MD 06/12/15 2253

## 2015-06-12 NOTE — ED Notes (Signed)
Pt reports having a cough, sob, dry itchy throat. No acute distress noted at triage.

## 2015-07-09 ENCOUNTER — Ambulatory Visit: Payer: Medicare Other | Admitting: Podiatry

## 2015-07-21 ENCOUNTER — Encounter: Payer: Self-pay | Admitting: Podiatry

## 2015-07-21 ENCOUNTER — Ambulatory Visit (INDEPENDENT_AMBULATORY_CARE_PROVIDER_SITE_OTHER): Payer: Medicare Other | Admitting: Podiatry

## 2015-07-21 VITALS — BP 159/93 | HR 78

## 2015-07-21 DIAGNOSIS — M79606 Pain in leg, unspecified: Secondary | ICD-10-CM

## 2015-07-21 DIAGNOSIS — M722 Plantar fascial fibromatosis: Secondary | ICD-10-CM | POA: Diagnosis not present

## 2015-07-21 DIAGNOSIS — M21962 Unspecified acquired deformity of left lower leg: Secondary | ICD-10-CM | POA: Diagnosis not present

## 2015-07-21 MED ORDER — HYDROCODONE-ACETAMINOPHEN 7.5-325 MG PO TABS: 1.0000 | ORAL_TABLET | Freq: Four times a day (QID) | ORAL | Status: DC | PRN

## 2015-07-21 NOTE — Progress Notes (Signed)
Subjective: 45 year old female presents complaining of painful knots on bottom of right foot. When knots pop up they give her sharp shooting pain.  Patient points plantar fascial band along the medial strip of right foot.  Stated that due to foot pain she no longer works.    Objective: Hypermobile first ray with bunion deformity bilateral R>L.  Neurovascular status are within normal. Old keloid remain the same on right foot. No open skin lesions.  Assessment: Plantar fasciitis right foot. 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 Hallus valgus with bunion right.  Plan: Reviewed findings and available options.  Advised to wear well supported tennis shoes and shoe inserts. Surgical option reviewed, stabilize or plantar flex the first ray.  Patient requests pain medication at this time.

## 2015-07-21 NOTE — Patient Instructions (Signed)
Seen for painful knot on bottom of right foot. Noted of weakened first metatarsal and straining the plantar band.  Need to wear well supported tennis shoes and shoe inserts. Pain medication ordered as per request.

## 2015-09-23 ENCOUNTER — Encounter: Payer: Self-pay | Admitting: Podiatry

## 2015-09-23 ENCOUNTER — Ambulatory Visit (INDEPENDENT_AMBULATORY_CARE_PROVIDER_SITE_OTHER): Payer: Medicare Other | Admitting: Podiatry

## 2015-09-23 VITALS — BP 161/109 | HR 70

## 2015-09-23 DIAGNOSIS — M21962 Unspecified acquired deformity of left lower leg: Secondary | ICD-10-CM | POA: Diagnosis not present

## 2015-09-23 DIAGNOSIS — M792 Neuralgia and neuritis, unspecified: Secondary | ICD-10-CM

## 2015-09-23 DIAGNOSIS — M79606 Pain in leg, unspecified: Secondary | ICD-10-CM

## 2015-09-23 MED ORDER — HYDROCODONE-ACETAMINOPHEN 7.5-325 MG PO TABS: 1.0000 | ORAL_TABLET | Freq: Four times a day (QID) | ORAL | Status: DC | PRN

## 2015-09-23 NOTE — Patient Instructions (Signed)
Right foot pain. Getting work started. Having sporadic severe foot pain at night. Worse at night. Need to have pain medication at night. Norco prescribed.

## 2015-09-23 NOTE — Progress Notes (Signed)
Subjective: 45 year old female presents stating that she is getting part time job lined up 4 hours / day. Wants to know if that would be ok for she is having pain at the big joint on right foot that radiates to medial ankle where old surgical incision and keloid skin.  Stated that the pain is more often at night than during the day and needed to take pain medication. She needs another prescription. Patient is disabled from fibromyalgia, chronic case of asthma, sporadic severe foot pain.  Objective: Pain with hypermobile first ray with bunion deformity bilateral R>L.  Neurovascular status are within normal. Old keloid remain the same on right foot. No open skin lesions.  Assessment: Arthralgia first MPJ right.  Metatarsal deformity, hypermobile first ray bilateral. Hallus valgus with bunion right.  Plan: Reviewed findings and available options.  Advised to wear well supported tennis shoes and shoe inserts. Surgical option reviewed, stabilize or plantar flex the first ray.  Pain medication re ordered, Norco 7.5/325.

## 2015-10-20 ENCOUNTER — Emergency Department (HOSPITAL_COMMUNITY)
Admission: EM | Admit: 2015-10-20 | Discharge: 2015-10-20 | Disposition: A | Discharge status: Home / Self Care | Payer: Medicare Other | Attending: Emergency Medicine | Admitting: Emergency Medicine

## 2015-10-20 ENCOUNTER — Encounter (HOSPITAL_COMMUNITY): Payer: Self-pay

## 2015-10-20 DIAGNOSIS — J45901 Unspecified asthma with (acute) exacerbation: Secondary | ICD-10-CM | POA: Diagnosis not present

## 2015-10-20 DIAGNOSIS — R0602 Shortness of breath: Secondary | ICD-10-CM | POA: Diagnosis present

## 2015-10-20 MED ORDER — ALBUTEROL SULFATE (2.5 MG/3ML) 0.083% IN NEBU
INHALATION_SOLUTION | RESPIRATORY_TRACT | Status: DC
Start: 2015-10-20 — End: 2015-10-20
  Filled 2015-10-20: qty 6

## 2015-10-20 MED ORDER — ALBUTEROL SULFATE (2.5 MG/3ML) 0.083% IN NEBU
5.0000 mg | INHALATION_SOLUTION | Freq: Once | RESPIRATORY_TRACT | Status: AC
Start: 2015-10-20 — End: 2015-10-20
  Administered 2015-10-20: 5 mg via RESPIRATORY_TRACT

## 2015-10-20 NOTE — ED Notes (Signed)
Pt. Reports having intermittent sob since last week with non-productive cough.  Walk ing increases the sob , walking up the stairs increases the sob.  She has a hx of asthma. Pt. Uses aeros and nebulizer.  Skin is warm and dry. Pt. Denies any chest pain . Pt. Denies any n/v/d  Alert and oriented X4

## 2015-10-20 NOTE — ED Notes (Signed)
Pt left due to family emergency.  

## 2015-10-20 NOTE — ED Notes (Signed)
Called pt to reasess vitals, no response.

## 2015-10-21 ENCOUNTER — Ambulatory Visit (HOSPITAL_COMMUNITY)
Admission: EM | Admit: 2015-10-21 | Discharge: 2015-10-21 | Disposition: A | Discharge status: Home / Self Care | Payer: Medicare Other | Attending: Emergency Medicine | Admitting: Emergency Medicine

## 2015-10-21 ENCOUNTER — Encounter (HOSPITAL_COMMUNITY): Payer: Self-pay | Admitting: *Deleted

## 2015-10-21 DIAGNOSIS — M940 Chondrocostal junction syndrome [Tietze]: Secondary | ICD-10-CM | POA: Diagnosis not present

## 2015-10-21 MED ORDER — TRAMADOL HCL 50 MG PO TABS: 50.0000 mg | ORAL_TABLET | Freq: Four times a day (QID) | ORAL | Status: DC | PRN

## 2015-10-21 NOTE — Discharge Instructions (Signed)
Costochondritis  Costochondritis is a condition in which the tissue (cartilage) that connects your ribs with your breastbone (sternum) becomes irritated. It causes pain in the chest and rib area. It usually goes away on its own over time.  HOME CARE  · Avoid activities that wear you out.  · Do not strain your ribs. Avoid activities that use your:    Chest.    Belly.    Side muscles.  · Put ice on the area for the first 2 days after the pain starts.    Put ice in a plastic bag.    Place a towel between your skin and the bag.    Leave the ice on for 20 minutes, 2-3 times a day.  · Only take medicine as told by your doctor.  GET HELP IF:  · You have redness or puffiness (swelling) in the rib area.  · Your pain does not go away with rest or medicine.  GET HELP RIGHT AWAY IF:   · Your pain gets worse.  · You are very uncomfortable.  · You have trouble breathing.  · You cough up blood.  · You start sweating or throwing up (vomiting).  · You have a fever or lasting symptoms for more than 2-3 days.  · You have a fever and your symptoms suddenly get worse.  MAKE SURE YOU:   · Understand these instructions.  · Will watch your condition.  · Will get help right away if you are not doing well or get worse.     This information is not intended to replace advice given to you by your health care provider. Make sure you discuss any questions you have with your health care provider.     Document Released: 10/19/2007 Document Revised: 01/02/2013 Document Reviewed: 12/04/2012  [employer]vier Interactive Patient Education ©2016 [employer]

## 2015-10-21 NOTE — ED Provider Notes (Signed)
CSN: [id number]     Arrival date & time 10/21/15  1325 History   First MD Initiated Contact with Patient 10/21/15 1418     Chief Complaint  Patient presents with  . Cough   (Consider location/radiation/quality/duration/timing/severity/associated sxs/prior Treatment) HPI History obtained from patient:  Pt presents with the cc of:  Midsternal chest pain Duration of symptoms: Over 1 week Treatment prior to arrival: No treatment Context: Recent URI now hurts when she takes a deep breath and worse when laying down Other symptoms include: Cough Pain score: 4 FAMILY HISTORY: Hypertension-mother    Past Medical History  Diagnosis Date  . Fibromyalgia   . Seizures ([place])   . Asthma    Past Surgical History  Procedure Laterality Date  . Heel spur resect w/plantar fasciotomy Right 09-22-03    Right foot  . Neurectomy foot Right 01/26/04    Right foot, Plantar calc.  . Tarsal tunnel release Right 01/26/04    Right foot  . Neuroplasty digital n. Right 07/02/07    Right x 3  . Tarsal tunnel release Right 07/02/07    Right foot   History reviewed. No pertinent family history. Social History  Substance Use Topics  . Smoking status: Never Smoker   . Smokeless tobacco: Never Used  . Alcohol Use: No   OB History    Gravida Para Term Preterm AB TAB SAB Ectopic Multiple Living   5 4  4 1  1   4      Review of Systems  Denies: HEADACHE, NAUSEA, ABDOMINAL PAIN, CHEST PAIN, CONGESTION, DYSURIA, SHORTNESS OF BREATH  Allergies  Review of patient's allergies indicates no known allergies.  Home Medications   Prior to Admission medications   Medication Sig Start Date End Date Taking? Authorizing Provider  albuterol (PROVENTIL HFA;VENTOLIN HFA) 108 (90 BASE) MCG/ACT inhaler Inhale 2 puffs into the lungs every 6 (six) hours as needed for wheezing or shortness of breath.    Historical Provider, MD  amitriptyline (ELAVIL) 50 MG tablet Take 50 mg by mouth at bedtime.    Historical Provider, MD   benzonatate (TESSALON) 100 MG capsule Take 1 capsule (100 mg total) by mouth every 8 (eight) hours. 06/12/15   Jola Schmidt, MD  diclofenac (VOLTAREN) 50 MG EC tablet Take 1 tablet (50 mg total) by mouth 2 (two) times daily as needed. 10/10/13   Gregor Hams, MD  DIMETHICONE, TOPICAL, 2 % CREA Apply 1 application topically 2 (two) times daily. 09/17/13   Myeong O Sheard, DPM  fluticasone (FLONASE) 50 MCG/ACT nasal spray Place 1 spray into both nostrils daily.  07/22/13   Historical Provider, MD  Fluticasone-Salmeterol (ADVAIR) 250-50 MCG/DOSE AEPB Inhale 1 puff into the lungs every 12 (twelve) hours.    Historical Provider, MD  gabapentin (NEURONTIN) 300 MG capsule Take 600 mg by mouth 2 (two) times daily.    Historical Provider, MD  HYDROcodone-acetaminophen (NORCO) 7.5-325 MG tablet Take 1 tablet by mouth every 6 (six) hours as needed for moderate pain. 09/23/15   Myeong O Sheard, DPM  levETIRAcetam (KEPPRA) 500 MG tablet Take 500 mg by mouth 2 (two) times daily.    Historical Provider, MD  loratadine (CLARITIN) 10 MG tablet Take 10 mg by mouth at bedtime.    Historical Provider, MD  megestrol (MEGACE) 40 MG tablet Take 1 tablet (40 mg total) by mouth 2 (two) times daily. 02/16/15   Tanna Savoy Stinson, DO  rOPINIRole (REQUIP) 0.25 MG tablet Take 0.25 mg by mouth every evening.  Historical Provider, MD  traMADol (ULTRAM) 50 MG tablet Take 1 tablet (50 mg total) by mouth every 6 (six) hours as needed. 10/21/15   Konrad Felix, PA   Meds Ordered and Administered this Visit  Medications - No data to display  BP 130/74 mmHg  Pulse 88  Temp(Src) 98.6 F (37 C) (Oral)  Resp 18  SpO2 99%  LMP 10/18/2015 No data found.   Physical Exam NURSES NOTES AND VITAL SIGNS REVIEWED. CONSTITUTIONAL: Well developed, well nourished, no acute distress HEENT: normocephalic, atraumatic EYES: Conjunctiva normal NECK:normal ROM, supple, no adenopathy PULMONARY:No respiratory distress, normal effort, tender  costosternal margin, reproducible.  ABDOMINAL: Soft, ND, NT BS+, No CVAT MUSCULOSKELETAL: Normal ROM of all extremities,  SKIN: warm and dry without rash PSYCHIATRIC: Mood and affect, behavior are normal  ED Course  Procedures (including critical care time)  Labs Review Labs Reviewed - No data to display  Imaging Review No results found.   Visual Acuity Review  Right Eye Distance:   Left Eye Distance:   Bilateral Distance:    Right Eye Near:   Left Eye Near:    Bilateral Near:      Expect full recovery from this illness.  RX: tramadol and iburprofen along with moist heat.  MDM   1. Acute costochondritis     Patient is reassured that there are no issues that require transfer to higher level of care at this time or additional tests. Patient is advised to continue home symptomatic treatment. Patient is advised that if there are new or worsening symptoms to attend the emergency department, contact primary care provider, or return to UC. Instructions of care provided discharged home in stable condition.    THIS NOTE WAS GENERATED USING A VOICE RECOGNITION SOFTWARE PROGRAM. ALL REASONABLE EFFORTS  WERE MADE TO PROOFREAD THIS DOCUMENT FOR ACCURACY.  I have verbally reviewed the discharge instructions with the patient. A printed AVS was given to the patient.  All questions were answered prior to discharge.      Konrad Felix, [place] 10/21/15 1730

## 2015-10-21 NOTE — ED Notes (Signed)
Pt  Reports   Symptoms  Of  Cough  For  Several    Days        Off  And  On           Symptoms  Began  Last    Week      Pt  Reports   Symptoms  Of    Congested   As  Well

## 2015-11-18 ENCOUNTER — Ambulatory Visit (INDEPENDENT_AMBULATORY_CARE_PROVIDER_SITE_OTHER): Payer: Medicare Other | Admitting: Podiatry

## 2015-11-18 ENCOUNTER — Encounter: Payer: Self-pay | Admitting: Podiatry

## 2015-11-18 VITALS — BP 158/100 | HR 70

## 2015-11-18 DIAGNOSIS — M79606 Pain in leg, unspecified: Secondary | ICD-10-CM

## 2015-11-18 DIAGNOSIS — M722 Plantar fascial fibromatosis: Secondary | ICD-10-CM

## 2015-11-18 DIAGNOSIS — M21961 Unspecified acquired deformity of right lower leg: Secondary | ICD-10-CM | POA: Diagnosis not present

## 2015-11-18 DIAGNOSIS — M21962 Unspecified acquired deformity of left lower leg: Secondary | ICD-10-CM

## 2015-11-18 DIAGNOSIS — R262 Difficulty in walking, not elsewhere classified: Secondary | ICD-10-CM | POA: Diagnosis not present

## 2015-11-18 DIAGNOSIS — M2011 Hallux valgus (acquired), right foot: Secondary | ICD-10-CM | POA: Diagnosis not present

## 2015-11-18 MED ORDER — HYDROCODONE-ACETAMINOPHEN 7.5-325 MG PO TABS: 1.0000 | ORAL_TABLET | Freq: Four times a day (QID) | ORAL | Status: DC | PRN

## 2015-11-18 NOTE — Progress Notes (Signed)
Subjective: 45 year old female presents stating that her right foot hurts at the bunion area. Pain shoots to back of heel when walking. Wearing flat shoes.   History: Patient is disabled from fibromyalgia, chronic case of asthma, sporadic severe foot pain.  Objective: Pain with hypermobile first ray with bunion deformity bilateral R>L.  Neurovascular status are within normal. Old keloid remain the same on right foot. No open skin lesions.  Assessment: Arthralgia first MPJ right.  Metatarsal deformity, hypermobile first ray bilateral. Hallus valgus with bunion right.  Plan: Reviewed findings and available options.  Advised to wear well supported tennis shoes and shoe inserts. Surgical option reviewed, stabilize or plantar flex the first ray.  Pain medication re ordered, Norco 7.5/325.

## 2015-11-18 NOTE — Patient Instructions (Signed)
Seen for pain in right bunion area. May benefit from custom or OTC orthotics in tennis shoes. Medication re ordered. May benefit from surgery if pain continues.

## 2015-12-03 ENCOUNTER — Encounter: Payer: Self-pay | Admitting: Podiatry

## 2015-12-03 ENCOUNTER — Ambulatory Visit (INDEPENDENT_AMBULATORY_CARE_PROVIDER_SITE_OTHER): Payer: Medicare Other | Admitting: Podiatry

## 2015-12-03 VITALS — BP 133/87 | HR 76

## 2015-12-03 DIAGNOSIS — M25572 Pain in left ankle and joints of left foot: Secondary | ICD-10-CM | POA: Diagnosis not present

## 2015-12-03 DIAGNOSIS — M21619 Bunion of unspecified foot: Secondary | ICD-10-CM

## 2015-12-03 DIAGNOSIS — M21962 Unspecified acquired deformity of left lower leg: Secondary | ICD-10-CM

## 2015-12-03 NOTE — Patient Instructions (Signed)
Pre op consent form reviewed for [facility] bunionectomy right.

## 2015-12-03 NOTE — Progress Notes (Signed)
Subjective: 45 year old female presents stating that her right foot hurts at the bunion area and wants surgical correction. Pain is day and night whether on feet or not. Patient points right foot bunion being the source of irritation in shoe and pain at night.  History: Patient is disabled from fibromyalgia, chronic case of asthma, sporadic severe foot pain.  Objective: Pain with hypermobile first ray with bunion deformity bilateral R>L.  Neurovascular status are within normal. Old keloid remain the same on right foot. No open skin lesions.  Radiographic examination reveal enlarged medial eminence of the first first metatarsal right. Hallux valgus with increased first intermetatarsal angle and Fibular sesamoid position at 4.  Lateral view reveal excess bone formation at inferior calcaneal tuberosity area.  Assessment: Arthralgia first MPJ right.  Metatarsal deformity, hypermobile first ray bilateral. Hallus valgus with bunion right.  Plan: Reviewed findings and available options.  Surgery consent form reviewed for [facility] bunionectomy right.

## 2015-12-04 ENCOUNTER — Encounter (HOSPITAL_COMMUNITY): Payer: Self-pay | Admitting: Emergency Medicine

## 2015-12-04 ENCOUNTER — Ambulatory Visit (HOSPITAL_COMMUNITY)
Admission: EM | Admit: 2015-12-04 | Discharge: 2015-12-04 | Disposition: A | Discharge status: Home / Self Care | Payer: Medicare Other | Attending: Family Medicine | Admitting: Family Medicine

## 2015-12-04 DIAGNOSIS — R05 Cough: Secondary | ICD-10-CM

## 2015-12-04 DIAGNOSIS — J45901 Unspecified asthma with (acute) exacerbation: Secondary | ICD-10-CM | POA: Diagnosis not present

## 2015-12-04 DIAGNOSIS — J40 Bronchitis, not specified as acute or chronic: Secondary | ICD-10-CM

## 2015-12-04 DIAGNOSIS — R059 Cough, unspecified: Secondary | ICD-10-CM

## 2015-12-04 MED ORDER — METHYLPREDNISOLONE 4 MG PO TBPK: ORAL_TABLET | ORAL | Status: DC

## 2015-12-04 MED ORDER — BENZONATATE 100 MG PO CAPS: 200.0000 mg | ORAL_CAPSULE | Freq: Three times a day (TID) | ORAL | Status: DC | PRN

## 2015-12-04 MED ORDER — AZITHROMYCIN 250 MG PO TABS: ORAL_TABLET | ORAL | Status: DC

## 2015-12-04 MED ORDER — ALBUTEROL SULFATE HFA 108 (90 BASE) MCG/ACT IN AERS: 2.0000 | INHALATION_SPRAY | RESPIRATORY_TRACT | Status: AC | PRN

## 2015-12-04 NOTE — Discharge Instructions (Signed)

## 2015-12-04 NOTE — ED Notes (Signed)
The patient presented to the [facility] with a complaint of nasal congestion and a night time cough for 6 days.

## 2015-12-05 NOTE — ED Provider Notes (Signed)
CSN: [id number]     Arrival date & time 12/04/15  1142 History   First MD Initiated Contact with Patient 12/04/15 1234     Chief Complaint  Patient presents with  . Nasal Congestion  . Cough   (Consider location/radiation/quality/duration/timing/severity/associated sxs/prior Treatment) HPI  Past Medical History  Diagnosis Date  . Fibromyalgia   . Seizures ([place])   . Asthma    Past Surgical History  Procedure Laterality Date  . Heel spur resect w/plantar fasciotomy Right 09-22-03    Right foot  . Neurectomy foot Right 01/26/04    Right foot, Plantar calc.  . Tarsal tunnel release Right 01/26/04    Right foot  . Neuroplasty digital n. Right 07/02/07    Right x 3  . Tarsal tunnel release Right 07/02/07    Right foot   History reviewed. No pertinent family history. Social History  Substance Use Topics  . Smoking status: Never Smoker   . Smokeless tobacco: Never Used  . Alcohol Use: No   OB History    Gravida Para Term Preterm AB TAB SAB Ectopic Multiple Living   5 4  4 1  1   4      Review of Systems  Allergies  Review of patient's allergies indicates no known allergies.  Home Medications   Prior to Admission medications   Medication Sig Start Date End Date Taking? Authorizing Provider  albuterol (PROVENTIL HFA;VENTOLIN HFA) 108 (90 BASE) MCG/ACT inhaler Inhale 2 puffs into the lungs every 6 (six) hours as needed for wheezing or shortness of breath.   Yes Historical Provider, MD  amitriptyline (ELAVIL) 50 MG tablet Take 50 mg by mouth at bedtime.   Yes Historical Provider, MD  fluticasone (FLONASE) 50 MCG/ACT nasal spray Place 1 spray into both nostrils daily.  07/22/13  Yes Historical Provider, MD  Fluticasone-Salmeterol (ADVAIR) 250-50 MCG/DOSE AEPB Inhale 1 puff into the lungs every 12 (twelve) hours.   Yes Historical Provider, MD  gabapentin (NEURONTIN) 300 MG capsule Take 600 mg by mouth 2 (two) times daily.   Yes Historical Provider, MD  levETIRAcetam (KEPPRA) 500 MG  tablet Take 500 mg by mouth 2 (two) times daily.   Yes Historical Provider, MD  loratadine (CLARITIN) 10 MG tablet Take 10 mg by mouth at bedtime.   Yes Historical Provider, MD  megestrol (MEGACE) 40 MG tablet Take 1 tablet (40 mg total) by mouth 2 (two) times daily. 02/16/15  Yes Tanna Savoy Stinson, DO  traMADol (ULTRAM) 50 MG tablet Take 1 tablet (50 mg total) by mouth every 6 (six) hours as needed. 10/21/15  Yes Konrad Felix, PA  albuterol (PROVENTIL HFA;VENTOLIN HFA) 108 (90 Base) MCG/ACT inhaler Inhale 2 puffs into the lungs every 4 (four) hours as needed for wheezing or shortness of breath. 12/04/15   Lysbeth Penner, FNP  azithromycin (ZITHROMAX) 250 MG tablet Take 2 po first day and then one po qd x 4 days 12/04/15   Lysbeth Penner, FNP  benzonatate (TESSALON) 100 MG capsule Take 1 capsule (100 mg total) by mouth every 8 (eight) hours. 06/12/15   Jola Schmidt, MD  benzonatate (TESSALON) 100 MG capsule Take 2 capsules (200 mg total) by mouth 3 (three) times daily as needed for cough. 12/04/15   Lysbeth Penner, FNP  diclofenac (VOLTAREN) 50 MG EC tablet Take 1 tablet (50 mg total) by mouth 2 (two) times daily as needed. 10/10/13   Gregor Hams, MD  DIMETHICONE, TOPICAL, 2 % CREA Apply 1  application topically 2 (two) times daily. 09/17/13   Myeong O Sheard, DPM  HYDROcodone-acetaminophen (NORCO) 7.5-325 MG tablet Take 1 tablet by mouth every 6 (six) hours as needed for moderate pain. 11/18/15   Myeong Roxine Caddy, DPM  methylPREDNISolone (MEDROL DOSEPAK) 4 MG TBPK tablet Take 6-5-4-3-2-1 po qd 12/04/15   Lysbeth Penner, FNP  rOPINIRole (REQUIP) 0.25 MG tablet Take 0.25 mg by mouth every evening.    Historical Provider, MD   Meds Ordered and Administered this Visit  Medications - No data to display  BP 134/97 mmHg  Pulse 82  Temp(Src) 98.4 F (36.9 C) (Oral)  Resp 12  SpO2 99%  LMP 11/04/2015 (Approximate) No data found.   Physical Exam  ED Course  Procedures (including critical care  time)  Labs Review Labs Reviewed - No data to display  Imaging Review No results found.   Visual Acuity Review  Right Eye Distance:   Left Eye Distance:   Bilateral Distance:    Right Eye Near:   Left Eye Near:    Bilateral Near:         MDM   1. Bronchitis   2. Cough   3. Asthma exacerbation        Billy Fischer, MD 12/22/15 2041

## 2015-12-13 IMAGING — US US TRANSVAGINAL NON-OB
1 series · 14 of 22 positions shown · non-contrast
Comparison: None

CLINICAL DATA: 43-year-old female with right pelvic pain. Negative
pregnancy test. Initial encounter.

EXAM:
TRANSABDOMINAL AND TRANSVAGINAL ULTRASOUND OF PELVIS
TECHNIQUE: Both transabdominal and transvaginal ultrasound examinations of the
pelvis were performed. Transabdominal technique was performed for
global imaging of the pelvis including uterus, ovaries, adnexal
regions, and pelvic cul-de-sac. It was necessary to proceed with
endovaginal exam following the transabdominal exam to visualize the
ovaries and endometrium.

[Series 1: us pelvis complete · 14 of 22 slices shown]
[im 1/22]
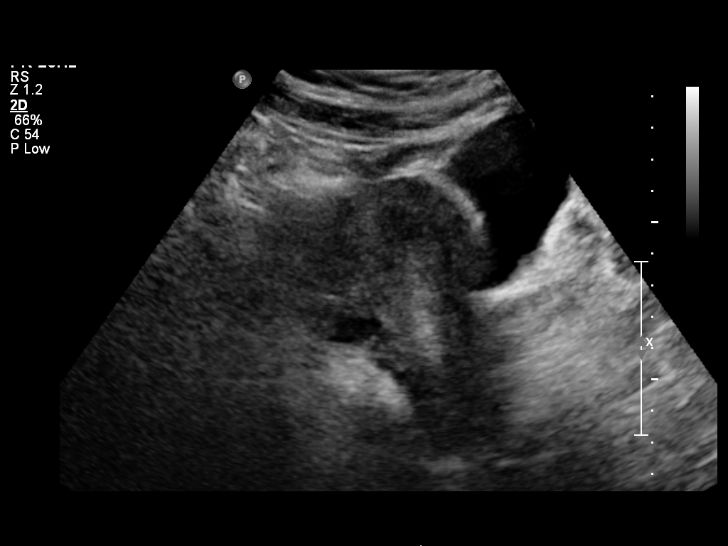
[im 3/22]
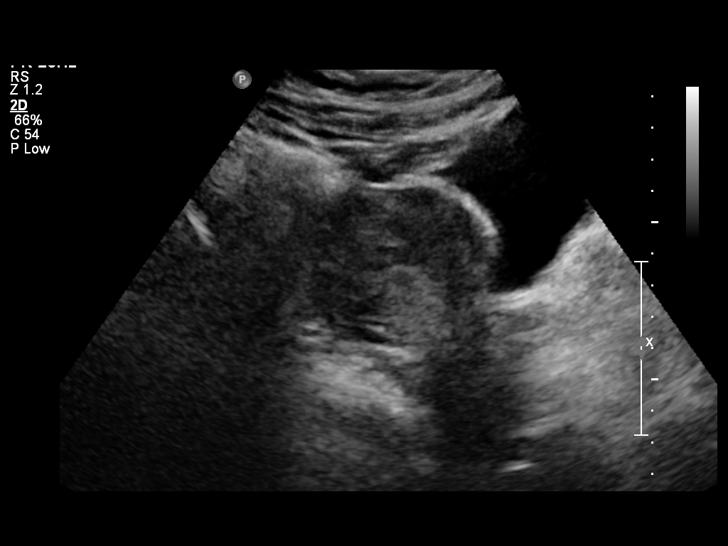
[im 4/22]
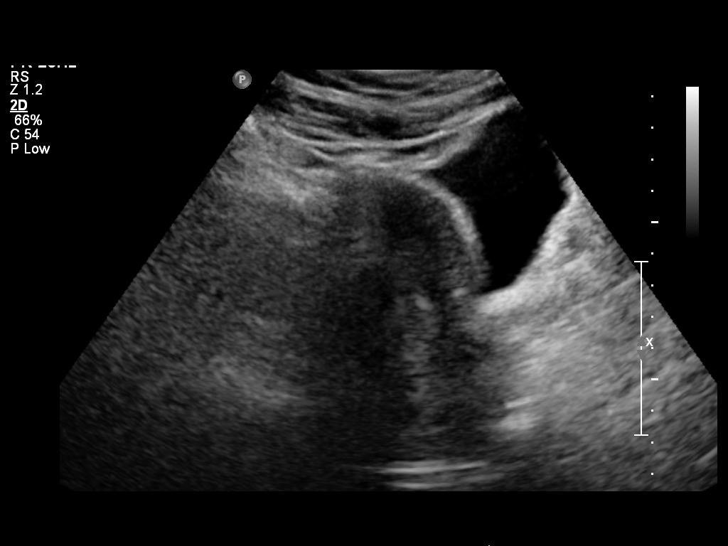
[im 6/22]
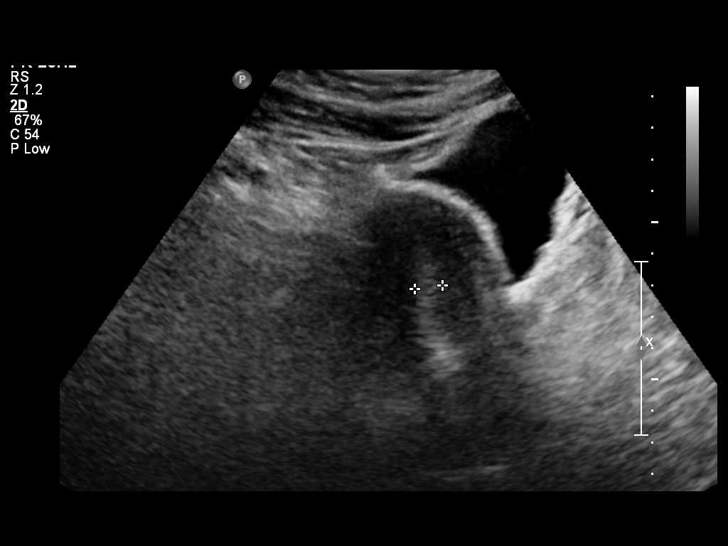
[im 8/22]
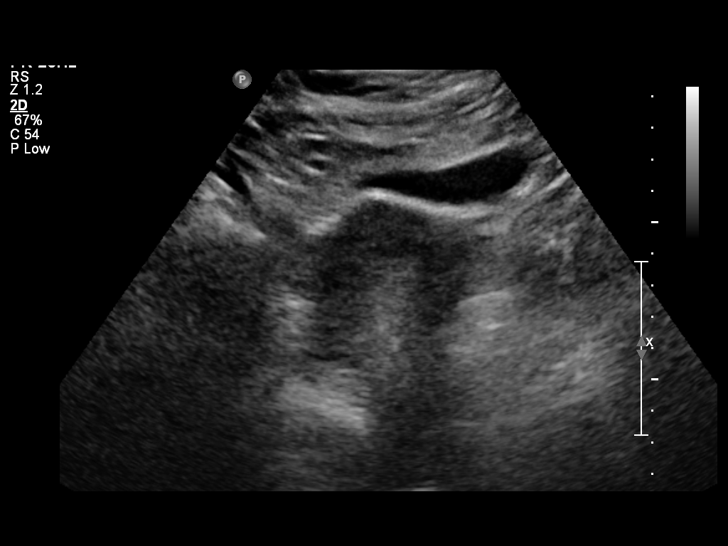
[im 9/22]
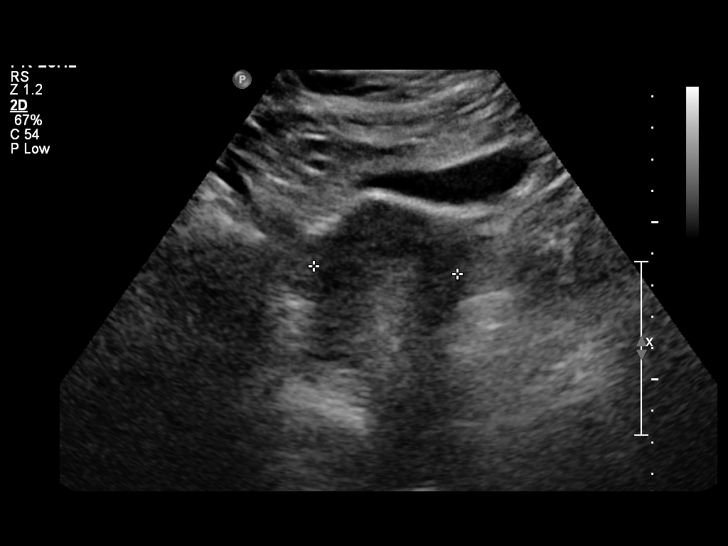
[im 11/22]
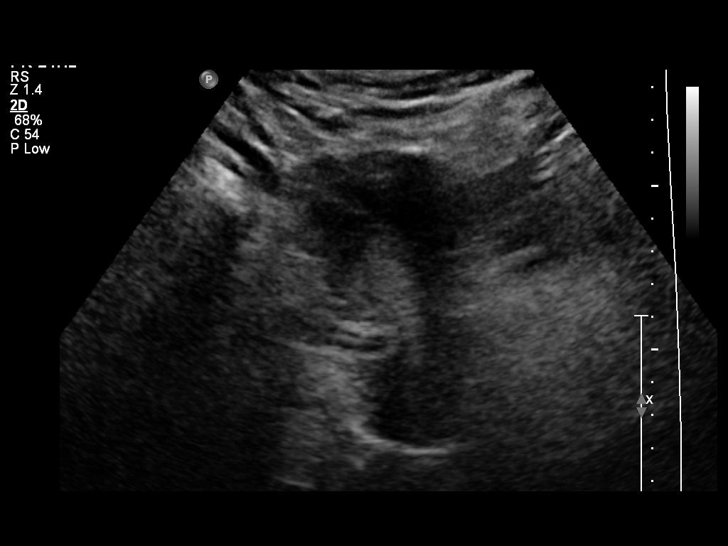
[im 12/22]
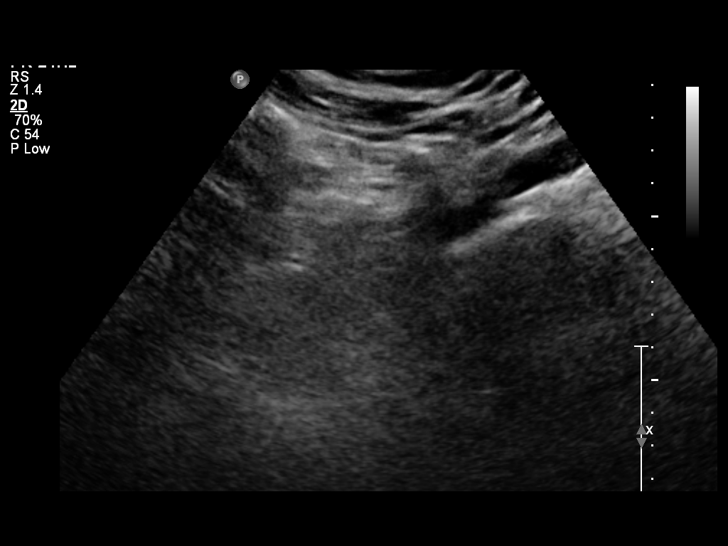
[im 14/22]
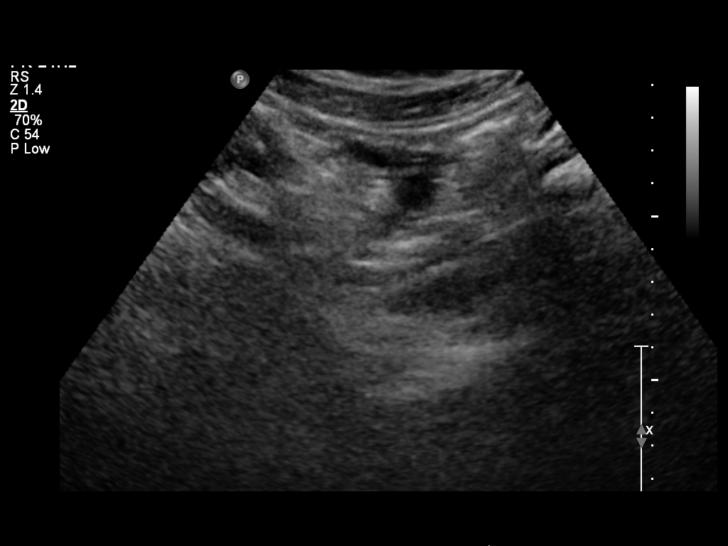
[im 15/22]
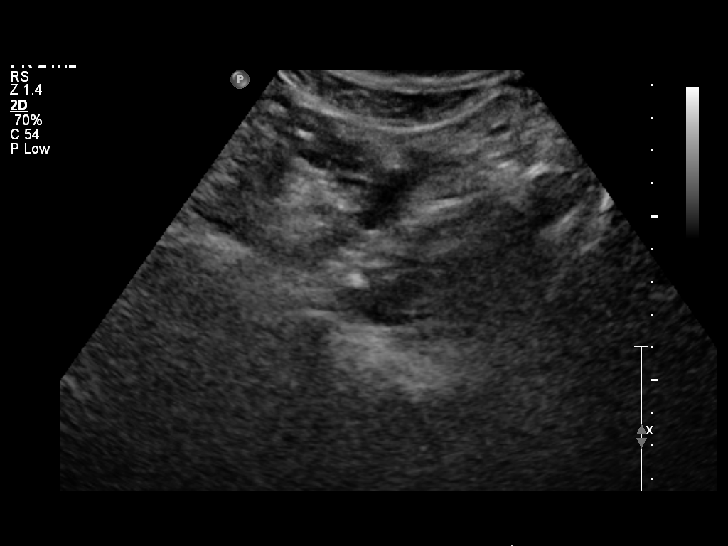
[im 17/22]
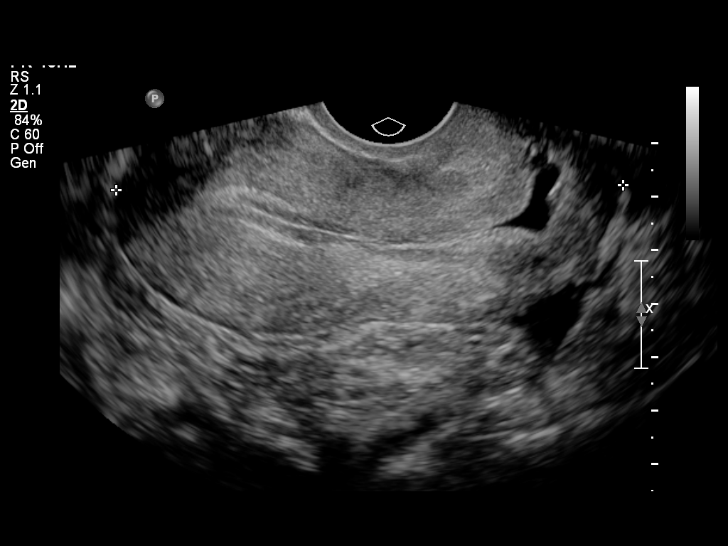
[im 19/22]
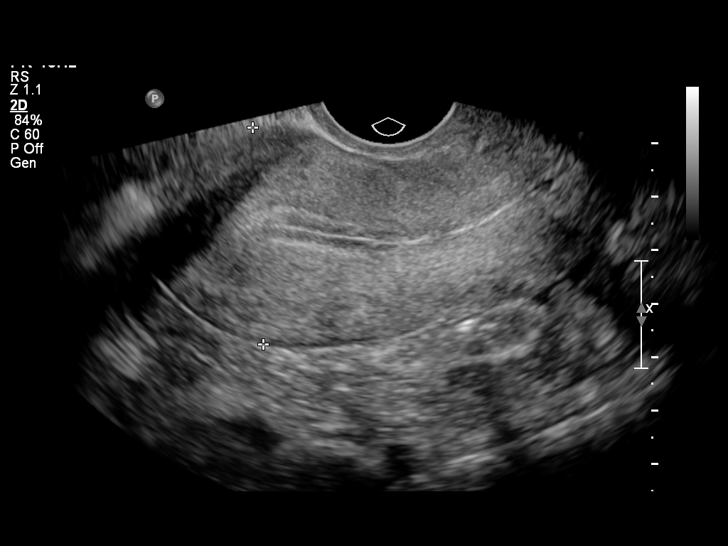
[im 20/22]
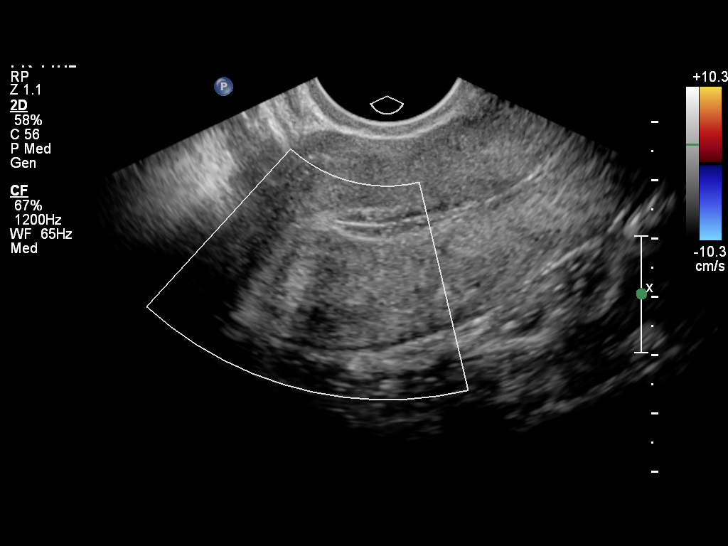
[im 22/22]
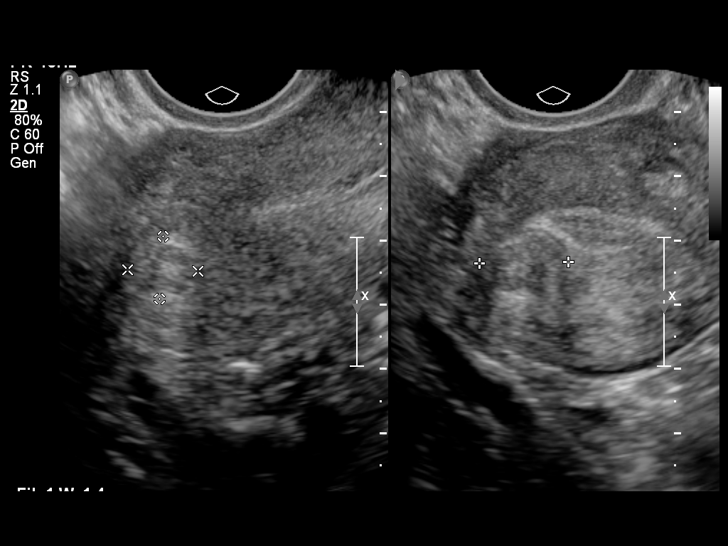

[14 of 22 positions shown; findings below may reference images not displayed]

FINDINGS: The patient terminated the exam due to pain.

Uterus

Measurements: 9.5 x 5.1 x 4.1 cm. A 1.4 x 1.1 x 1 cm intramural
right posterior fundal fibroid identified. No other uterine
abnormalities are identified.

Endometrium

Thickness: 7.5 mm.  No focal abnormality visualized.

Right ovary

Measurements: Not visualized.

Left ovary

Measurements: Not visualized.

Other findings

A trace amount of free fluid within the posterior cul-de-sac noted.
IMPRESSION: 1.4 x 1.1 x 1 cm intramural right posterior fundal fibroid.

Ovaries bilaterally not visualized.

Trace amount of free pelvic fluid -likely physiologic.

## 2015-12-22 NOTE — ED Provider Notes (Signed)
CSN: [id number]     Arrival date & time 12/04/15  1142 History   First MD Initiated Contact with Patient 12/04/15 1234     Chief Complaint  Patient presents with  . Nasal Congestion  . Cough   (Consider location/radiation/quality/duration/timing/severity/associated sxs/prior Treatment) Patient is having a lot of coughing and wheezing.  She has hx of asthma and is out of her SABA MDI.    Cough  Cough characteristics:  Productive Severity:  Moderate Onset quality:  Gradual Duration:  2 weeks Timing:  Intermittent Progression:  Waxing and waning Chronicity:  Recurrent Context: upper respiratory infection   Relieved by:  None tried Worsened by:  Nothing Ineffective treatments:  None tried Associated symptoms: wheezing     Past Medical History:  Diagnosis Date  . Asthma   . Fibromyalgia   . Seizures ([place])    Past Surgical History:  Procedure Laterality Date  . Heel spur resect w/Plantar Fasciotomy Right 09-22-03   Right foot  . NEURECTOMY FOOT Right 01/26/04   Right foot, Plantar calc.  Marland Kitchen Neuroplasty digital n. Right 07/02/07   Right x 3  . TARSAL TUNNEL RELEASE Right 01/26/04   Right foot  . TARSAL TUNNEL RELEASE Right 07/02/07   Right foot   History reviewed. No pertinent family history. Social History  Substance Use Topics  . Smoking status: Never Smoker  . Smokeless tobacco: Never Used  . Alcohol use No   OB History    Gravida Para Term Preterm AB Living   5 4   4 1 4    SAB TAB Ectopic Multiple Live Births   1             Review of Systems  Constitutional: Negative.   HENT: Negative.   Eyes: Negative.   Respiratory: Positive for cough and wheezing.   Gastrointestinal: Negative.   Endocrine: Negative.   Genitourinary: Negative.   Musculoskeletal: Negative.   Skin: Negative.   Allergic/Immunologic: Negative.   Neurological: Negative.   Hematological: Negative.   Psychiatric/Behavioral: Negative.     Allergies  Review of patient's allergies  indicates no known allergies.  Home Medications   Prior to Admission medications   Medication Sig Start Date End Date Taking? Authorizing Provider  albuterol (PROVENTIL HFA;VENTOLIN HFA) 108 (90 BASE) MCG/ACT inhaler Inhale 2 puffs into the lungs every 6 (six) hours as needed for wheezing or shortness of breath.   Yes Historical Provider, MD  amitriptyline (ELAVIL) 50 MG tablet Take 50 mg by mouth at bedtime.   Yes Historical Provider, MD  fluticasone (FLONASE) 50 MCG/ACT nasal spray Place 1 spray into both nostrils daily.  07/22/13  Yes Historical Provider, MD  Fluticasone-Salmeterol (ADVAIR) 250-50 MCG/DOSE AEPB Inhale 1 puff into the lungs every 12 (twelve) hours.   Yes Historical Provider, MD  gabapentin (NEURONTIN) 300 MG capsule Take 600 mg by mouth 2 (two) times daily.   Yes Historical Provider, MD  levETIRAcetam (KEPPRA) 500 MG tablet Take 500 mg by mouth 2 (two) times daily.   Yes Historical Provider, MD  loratadine (CLARITIN) 10 MG tablet Take 10 mg by mouth at bedtime.   Yes Historical Provider, MD  megestrol (MEGACE) 40 MG tablet Take 1 tablet (40 mg total) by mouth 2 (two) times daily. 02/16/15  Yes Tanna Savoy Stinson, DO  traMADol (ULTRAM) 50 MG tablet Take 1 tablet (50 mg total) by mouth every 6 (six) hours as needed. 10/21/15  Yes Konrad Felix, PA  albuterol (PROVENTIL HFA;VENTOLIN HFA) 108 (90 Base)  MCG/ACT inhaler Inhale 2 puffs into the lungs every 4 (four) hours as needed for wheezing or shortness of breath. 12/04/15   Lysbeth Penner, FNP  azithromycin (ZITHROMAX) 250 MG tablet Take 2 po first day and then one po qd x 4 days 12/04/15   Lysbeth Penner, FNP  benzonatate (TESSALON) 100 MG capsule Take 1 capsule (100 mg total) by mouth every 8 (eight) hours. 06/12/15   Jola Schmidt, MD  benzonatate (TESSALON) 100 MG capsule Take 2 capsules (200 mg total) by mouth 3 (three) times daily as needed for cough. 12/04/15   Lysbeth Penner, FNP  diclofenac (VOLTAREN) 50 MG EC tablet Take 1  tablet (50 mg total) by mouth 2 (two) times daily as needed. 10/10/13   Gregor Hams, MD  DIMETHICONE, TOPICAL, 2 % CREA Apply 1 application topically 2 (two) times daily. 09/17/13   Myeong O Sheard, DPM  HYDROcodone-acetaminophen (NORCO) 7.5-325 MG tablet Take 1 tablet by mouth every 6 (six) hours as needed for moderate pain. 11/18/15   Myeong Roxine Caddy, DPM  methylPREDNISolone (MEDROL DOSEPAK) 4 MG TBPK tablet Take 6-5-4-3-2-1 po qd 12/04/15   Lysbeth Penner, FNP  rOPINIRole (REQUIP) 0.25 MG tablet Take 0.25 mg by mouth every evening.    Historical Provider, MD   Meds Ordered and Administered this Visit  Medications - No data to display  BP 134/97 (BP Location: Right Arm)   Pulse 82   Temp 98.4 F (36.9 C) (Oral)   Resp 12   LMP 11/04/2015 (Approximate)   SpO2 99%  No data found.   Physical Exam  Constitutional: She is oriented to person, place, and time. She appears well-developed and well-nourished.  HENT:  Head: Normocephalic and atraumatic.  Right Ear: External ear normal.  Left Ear: External ear normal.  Mouth/Throat: Oropharynx is clear and moist.  Eyes: Conjunctivae and EOM are normal. Pupils are equal, round, and reactive to light.  Neck: Normal range of motion. Neck supple.  Cardiovascular: Normal rate and regular rhythm.   Pulmonary/Chest: Effort normal and breath sounds normal.  Abdominal: Soft. Bowel sounds are normal.  Musculoskeletal: Normal range of motion.  Neurological: She is alert and oriented to person, place, and time.  Nursing note and vitals reviewed.   Urgent Care Course   Clinical Course    Procedures (including critical care time)  Labs Review Labs Reviewed - No data to display  Imaging Review No results found.   Visual Acuity Review  Right Eye Distance:   Left Eye Distance:   Bilateral Distance:    Right Eye Near:   Left Eye Near:    Bilateral Near:         MDM   1. Bronchitis   2. Cough   3. Asthma exacerbation     Albuterol MDI 2 puffs q 4 hours prn Zpak as directed Medrol dose pack as directed #21 Tessalon perles 100mg  one po tid prn #21  Push po fluids, rest, tylenol and motrin otc prn as directed for fever, arthralgias, and myalgias.  Follow up prn if sx's continue or persist.    Lysbeth Penner, FNP 12/22/15 [phone]

## 2015-12-24 ENCOUNTER — Other Ambulatory Visit: Payer: Self-pay | Admitting: Podiatry

## 2015-12-24 MED ORDER — HYDROCODONE-ACETAMINOPHEN 7.5-325 MG PO TABS: 1.0000 | ORAL_TABLET | Freq: Four times a day (QID) | ORAL | 0 refills | Status: DC | PRN

## 2015-12-25 ENCOUNTER — Encounter: Payer: Self-pay | Admitting: *Deleted

## 2015-12-25 DIAGNOSIS — M2011 Hallux valgus (acquired), right foot: Secondary | ICD-10-CM | POA: Diagnosis not present

## 2015-12-25 HISTORY — PX: BUNIONECTOMY: SHX129

## 2015-12-31 ENCOUNTER — Ambulatory Visit: Payer: Medicare Other | Admitting: Podiatry

## 2016-01-05 ENCOUNTER — Encounter: Payer: Self-pay | Admitting: Podiatry

## 2016-01-05 ENCOUNTER — Ambulatory Visit (INDEPENDENT_AMBULATORY_CARE_PROVIDER_SITE_OTHER): Payer: Medicare Other | Admitting: Podiatry

## 2016-01-05 DIAGNOSIS — M21619 Bunion of unspecified foot: Secondary | ICD-10-CM

## 2016-01-05 DIAGNOSIS — M21962 Unspecified acquired deformity of left lower leg: Secondary | ICD-10-CM | POA: Diagnosis not present

## 2016-01-05 DIAGNOSIS — M25572 Pain in left ankle and joints of left foot: Secondary | ICD-10-CM | POA: Diagnosis not present

## 2016-01-05 MED ORDER — HYDROCODONE-ACETAMINOPHEN 7.5-325 MG PO TABS: 1.0000 | ORAL_TABLET | Freq: Four times a day (QID) | ORAL | 0 refills | Status: DC | PRN

## 2016-01-05 NOTE — Patient Instructions (Signed)
6 days post op visit following right foot bunionectomy (01/01/16). Doing well and x-ray look normal. Keep it clean and dry. Stay off of feet. Return in 10 days.

## 2016-01-05 NOTE — Progress Notes (Signed)
1 week post op following right foot [place] bunionectomy (01/01/16). Denies any discomfort. No fever or pain in legs. Dressing is clean and dry. No edema or erythema.  Normal satisfactory progress. Will do minimum handling of the wound due to Keloid problem. Return in 10 days and keep the dressing clean and dry.

## 2016-01-06 ENCOUNTER — Ambulatory Visit: Payer: Medicare Other | Admitting: Podiatry

## 2016-01-19 ENCOUNTER — Encounter: Payer: Self-pay | Admitting: Podiatry

## 2016-01-19 ENCOUNTER — Ambulatory Visit (INDEPENDENT_AMBULATORY_CARE_PROVIDER_SITE_OTHER): Payer: Medicare Other | Admitting: Podiatry

## 2016-01-19 DIAGNOSIS — Z9889 Other specified postprocedural states: Secondary | ICD-10-CM

## 2016-01-19 NOTE — Progress Notes (Signed)
3 week post op following right foot [place] bunionectomy (01/01/16). Denies any discomfort. No fever or pain in legs. Dressings is clean and dry. No edema or erythema.  Rectus foot with corrected bunion deformity right foot.  Normal satisfactory progress. Will do minimum handling of the wound due to Keloid problem. Ok to show, but keep the inner tape undisturbed if possible.  Return in 2 weeks and keep the dressing clean and dry.

## 2016-01-19 NOTE — Patient Instructions (Signed)
Normal post op wound healing without complication. Ok to shower. Keep the inner tape undisturbed if possible. Return in 2 weeks.

## 2016-02-02 ENCOUNTER — Ambulatory Visit (INDEPENDENT_AMBULATORY_CARE_PROVIDER_SITE_OTHER): Payer: Medicare Other | Admitting: Podiatry

## 2016-02-02 ENCOUNTER — Encounter: Payer: Self-pay | Admitting: Podiatry

## 2016-02-02 DIAGNOSIS — Z9889 Other specified postprocedural states: Secondary | ICD-10-CM

## 2016-02-02 MED ORDER — HYDROCODONE-ACETAMINOPHEN 7.5-325 MG PO TABS: 1.0000 | ORAL_TABLET | Freq: Three times a day (TID) | ORAL | 0 refills | Status: DC | PRN

## 2016-02-02 NOTE — Patient Instructions (Signed)
4 weeks post op wound healing well without complication.  Pain medication prescribed as per request for occasional pain. Return in 3 weeks.

## 2016-02-02 NOTE — Progress Notes (Signed)
S/P 4 weeks post op following right foot [place] bunionectomy (01/01/16). Only little pain if up and on it too much, otherwise doing fine. Foot aches at times while in bed.  Incision site dry and healing well.  No edema or erythema.  Rectus foot with corrected bunion deformity right foot.  Normal satisfactory progress. Suture removed. Advised to keep Steri strips intact to avoid Keloid. Ok to work 3 hours a day. Stay in Surgical shoes. Return in 3. Weeks.Marland Kitchen

## 2016-02-24 ENCOUNTER — Ambulatory Visit (INDEPENDENT_AMBULATORY_CARE_PROVIDER_SITE_OTHER): Payer: Medicare Other | Admitting: Podiatry

## 2016-02-24 ENCOUNTER — Encounter: Payer: Self-pay | Admitting: Podiatry

## 2016-02-24 DIAGNOSIS — Z9889 Other specified postprocedural states: Secondary | ICD-10-CM

## 2016-02-24 MED ORDER — HYDROCODONE-ACETAMINOPHEN 7.5-325 MG PO TABS: 1.0000 | ORAL_TABLET | Freq: Three times a day (TID) | ORAL | 0 refills | Status: DC | PRN

## 2016-02-24 NOTE — Patient Instructions (Signed)
8 weeks following surgery. Healing well without complication. Ok to return to regular shoes. Take pain pill only if needed. Return as needed.

## 2016-02-24 NOTE — Progress Notes (Signed)
S/P 8 weeks [place] bunionectomy right foot *01/01/16) Right foot bunion site is better and does not hurt like before. Now been back to work 4 hours/day x 3 d/wk. Foot hurts only once in a while.  No edema, no erythema, no keloid noted. Normal range of motion with reduced deformity. Ok to return to regular shoes. Ok to resume regular activity as tolerated. Return as needed.

## 2016-03-29 ENCOUNTER — Telehealth: Payer: Self-pay | Admitting: *Deleted

## 2016-03-29 MED ORDER — HYDROCODONE-ACETAMINOPHEN 7.5-325 MG PO TABS: 1.0000 | ORAL_TABLET | Freq: Three times a day (TID) | ORAL | 0 refills | Status: DC | PRN

## 2016-03-29 NOTE — Telephone Encounter (Signed)
03/29/16 Patient called and ask can you refill her RX for Pain, hard for her to get off work she states, having a lot of pain also.

## 2016-04-11 ENCOUNTER — Encounter (HOSPITAL_COMMUNITY): Payer: Self-pay | Admitting: Emergency Medicine

## 2016-04-11 ENCOUNTER — Encounter: Payer: Self-pay | Admitting: Family Medicine

## 2016-04-11 ENCOUNTER — Ambulatory Visit (INDEPENDENT_AMBULATORY_CARE_PROVIDER_SITE_OTHER): Payer: Medicare Other | Admitting: Family Medicine

## 2016-04-11 ENCOUNTER — Ambulatory Visit (HOSPITAL_COMMUNITY)
Admission: EM | Admit: 2016-04-11 | Discharge: 2016-04-11 | Disposition: A | Discharge status: Home / Self Care | Payer: Medicare Other | Attending: Emergency Medicine | Admitting: Emergency Medicine

## 2016-04-11 VITALS — BP 155/100 | HR 68 | Wt 162.5 lb

## 2016-04-11 DIAGNOSIS — N921 Excessive and frequent menstruation with irregular cycle: Secondary | ICD-10-CM | POA: Diagnosis not present

## 2016-04-11 DIAGNOSIS — J4541 Moderate persistent asthma with (acute) exacerbation: Secondary | ICD-10-CM

## 2016-04-11 MED ORDER — PREDNISONE 10 MG PO TABS: 50.0000 mg | ORAL_TABLET | Freq: Every day | ORAL | 0 refills | Status: AC

## 2016-04-11 MED ORDER — BENZONATATE 100 MG PO CAPS: 100.0000 mg | ORAL_CAPSULE | Freq: Three times a day (TID) | ORAL | 0 refills | Status: DC | PRN

## 2016-04-11 MED ORDER — HYDROCOD POLST-CPM POLST ER 10-8 MG/5ML PO SUER: 5.0000 mL | Freq: Every evening | ORAL | 0 refills | Status: DC | PRN

## 2016-04-11 NOTE — Progress Notes (Signed)
   Subjective:    Patient ID: Isabel Shaw is a 45 y.o. female presenting with Discuss Fibroids  on 04/11/2016  HPI: Has been on Megace which makes her bleed and cramp. She is having 2x/month cycles. Not heavy bleeding. Having hot flashes. While taking her Megace she gained 25# so she had to stop taking it.  Review of Systems  Constitutional: Negative for chills and fever.  Respiratory: Negative for shortness of breath.   Cardiovascular: Negative for chest pain.  Gastrointestinal: Negative for abdominal pain, nausea and vomiting.  Genitourinary: Negative for dysuria.  Skin: Negative for rash.      Objective:    BP (!) 155/100   Pulse 68   Wt 162 lb 8 oz (73.7 kg)   LMP 03/28/2016   BMI 32.82 kg/m  Physical Exam  Constitutional: She is oriented to person, place, and time. She appears well-developed and well-nourished. No distress.  HENT:  Head: Normocephalic and atraumatic.  Eyes: No scleral icterus.  Neck: Neck supple.  Cardiovascular: Normal rate.   Pulmonary/Chest: Effort normal.  Abdominal: Soft.  Neurological: She is alert and oriented to person, place, and time.  Skin: Skin is warm and dry.  Psychiatric: She has a normal mood and affect.      Assessment & Plan:   Problem List Items Addressed This Visit    None    Visit Diagnoses    Metrorrhagia    -  Primary   declines further testing--likely peri-menopausal.   Relevant Orders   TSH   Follicle stimulating hormone    Watch her BP  Total face-to-face time with patient: 15 minutes. Over 50% of encounter was spent on counseling and coordination of care. Return in about 4 weeks (around 05/09/2016) for a follow-up.  [name] 04/11/2016 2:16 PM

## 2016-04-11 NOTE — Patient Instructions (Signed)
Menorrhagia Menorrhagia is the medical term for when your menstrual periods are heavy or last longer than usual. With menorrhagia, every period you have may cause enough blood loss and cramping that you are unable to maintain your usual activities. CAUSES  In some cases, the cause of heavy periods is unknown, but a number of conditions may cause menorrhagia. Common causes include:  A problem with the hormone-producing thyroid gland (hypothyroid).  Noncancerous growths in the uterus (polyps or fibroids).  An imbalance of the estrogen and progesterone hormones.  One of your ovaries not releasing an egg during one or more months.  Side effects of having an intrauterine device (IUD).  Side effects of some medicines, such as anti-inflammatory medicines or blood thinners.  A bleeding disorder that stops your blood from clotting normally. SIGNS AND SYMPTOMS  During a normal period, bleeding lasts between 4 and 8 days. Signs that your periods are too heavy include:  You routinely have to change your pad or tampon every 1 or 2 hours because it is completely soaked.  You pass blood clots larger than 1 inch (2.5 cm) in size.  You have bleeding for more than 7 days.  You need to use pads and tampons at the same time because of heavy bleeding.  You need to wake up to change your pads or tampons during the night.  You have symptoms of anemia, such as tiredness, fatigue, or shortness of breath. DIAGNOSIS  Your health care provider will perform a physical exam and ask you questions about your symptoms and menstrual history. Other tests may be ordered based on what the health care provider finds during the exam. These tests can include:  Blood tests. Blood tests are used to check if you are pregnant or have hormonal changes, a bleeding or thyroid disorder, low iron levels (anemia), or other problems.  Endometrial biopsy. Your health care provider takes a sample of tissue from the inside of your  uterus to be examined under a microscope.  Pelvic ultrasound. This test uses sound waves to make a picture of your uterus, ovaries, and vagina. The pictures can show if you have fibroids or other growths.  Hysteroscopy. For this test, your health care provider will use a small telescope to look inside your uterus. Based on the results of your initial tests, your health care provider may recommend further testing. TREATMENT  Treatment may not be needed. If it is needed, your health care provider may recommend treatment with one or more medicines first. If these do not reduce bleeding enough, a surgical treatment might be an option. The best treatment for you will depend on:   Whether you need to prevent pregnancy.  Your desire to have children in the future.  The cause and severity of your bleeding.  Your opinion and personal preference.  Medicines for menorrhagia may include:  Birth control methods that use hormones. These include the pill, skin patch, vaginal ring, shots that you get every 3 months, hormonal IUD, and implant. These treatments reduce bleeding during your menstrual period.  Medicines that thicken blood and slow bleeding.  Medicines that reduce swelling, such as ibuprofen.  Medicines that contain a synthetic hormone called progestin.   Medicines that make the ovaries stop working for a short time.  You may need surgical treatment for menorrhagia if the medicines are unsuccessful. Treatment options include:  Dilation and curettage (D&C). In this procedure, your health care provider opens (dilates) your cervix and then scrapes or suctions tissue from   the lining of your uterus to reduce menstrual bleeding. °· Operative hysteroscopy. This procedure uses a tiny tube with a light (hysteroscope) to view your uterine cavity and can help in the surgical removal of a polyp that may be causing heavy periods. °· Endometrial ablation. Through various techniques, your health care  provider permanently destroys the entire lining of your uterus (endometrium). After endometrial ablation, most women have little or no menstrual flow. Endometrial ablation reduces your ability to become pregnant. °· Endometrial resection. This surgical procedure uses an electrosurgical wire loop to remove the lining of the uterus. This procedure also reduces your ability to become pregnant. °· Hysterectomy. Surgical removal of the uterus and cervix is a permanent procedure that stops menstrual periods. Pregnancy is not possible after a hysterectomy. This procedure requires anesthesia and hospitalization. °HOME CARE INSTRUCTIONS  °· Only take over-the-counter or prescription medicines as directed by your health care provider. Take prescribed medicines exactly as directed. Do not change or switch medicines without consulting your health care provider. °· Take any prescribed iron pills exactly as directed by your health care provider. Long-term heavy bleeding may result in low iron levels. Iron pills help replace the iron your body lost from heavy bleeding. Iron may cause constipation. If this becomes a problem, increase the bran, fruits, and roughage in your diet. °· Do not take aspirin or medicines that contain aspirin 1 week before or during your menstrual period. Aspirin may make the bleeding worse. °· If you need to change your sanitary pad or tampon more than once every 2 hours, stay in bed and rest as much as possible until the bleeding stops. °· Eat well-balanced meals. Eat foods high in iron. Examples are leafy green vegetables, meat, liver, eggs, and whole grain breads and cereals. Do not try to lose weight until the abnormal bleeding has stopped and your blood iron level is back to normal. °SEEK MEDICAL CARE IF:  °· You soak through a pad or tampon every 1 or 2 hours, and this happens every time you have a period. °· You need to use pads and tampons at the same time because you are bleeding so much. °· You  need to change your pad or tampon during the night. °· You have a period that lasts for more than 8 days. °· You pass clots bigger than 1 inch wide. °· You have irregular periods that happen more or less often than once a month. °· You feel dizzy or faint. °· You feel very weak or tired. °· You feel short of breath or feel your heart is beating too fast when you exercise. °· You have nausea and vomiting or diarrhea while you are taking your medicine. °· You have any problems that may be related to the medicine you are taking. °SEEK IMMEDIATE MEDICAL CARE IF:  °· You soak through 4 or more pads or tampons in 2 hours. °· You have any bleeding while you are pregnant. °MAKE SURE YOU:  °· Understand these instructions. °· Will watch your condition. °· Will get help right away if you are not doing well or get worse. °This information is not intended to replace advice given to you by your health care provider. Make sure you discuss any questions you have with your health care provider. °Document Released: 05/02/2005 Document Revised: 08/24/2015 Document Reviewed: 10/21/2012 °Elsevier Interactive Patient Education © 2017 [employer] ° °

## 2016-04-11 NOTE — ED Triage Notes (Signed)
PT has asthma. PT reports non productive cough for 4 days. PT reports cough is severe. PT reports cough is worse at night. PT also reports scratchy throat. PT denies congestion or drainage.

## 2016-04-11 NOTE — ED Provider Notes (Signed)
CSN: [id number]     Arrival date & time 04/11/16  1024 History   First MD Initiated Contact with Patient 04/11/16 1136     Chief Complaint  Patient presents with  . Cough   (Consider location/radiation/quality/duration/timing/severity/associated sxs/prior Treatment) 45 year old female presents with dry, non-productive cough for the past 4 days. Denies any fever, nasal congestion, chest congestion or GI symptoms. Has history of asthma and seasonal allergies- currently on Claritin, Flonase, Advair and Albuterol as needed. Has taken both inhalers this morning with minimal relief. She tends to have a flare up of asthma symptoms when the weather changes. She has also tried Dayquil with no relief.    The history is provided by the patient.    Past Medical History:  Diagnosis Date  . Asthma   . Fibromyalgia   . Seizures ([place])    Past Surgical History:  Procedure Laterality Date  . BUNIONECTOMY Right 12/25/2015  . Heel spur resect w/Plantar Fasciotomy Right 09-22-03   Right foot  . NEURECTOMY FOOT Right 01/26/04   Right foot, Plantar calc.  Marland Kitchen Neuroplasty digital n. Right 07/02/07   Right x 3  . TARSAL TUNNEL RELEASE Right 01/26/04   Right foot  . TARSAL TUNNEL RELEASE Right 07/02/07   Right foot   No family history on file. Social History  Substance Use Topics  . Smoking status: Never Smoker  . Smokeless tobacco: Never Used  . Alcohol use No   OB History    Gravida Para Term Preterm AB Living   5 4   4 1 4    SAB TAB Ectopic Multiple Live Births   1             Review of Systems  Constitutional: Positive for fatigue. Negative for appetite change, chills and fever.  HENT: Negative for congestion, ear pain, rhinorrhea, sinus pain, sinus pressure, sneezing and sore throat.   Eyes: Negative for discharge.  Respiratory: Positive for cough, chest tightness, shortness of breath and wheezing.   Cardiovascular: Negative for chest pain.  Gastrointestinal: Negative for abdominal pain,  diarrhea, nausea and vomiting.  Musculoskeletal: Negative for neck pain and neck stiffness.  Skin: Negative for rash.  Neurological: Negative for dizziness, weakness, light-headedness and headaches.  Hematological: Negative for adenopathy.    Allergies  Patient has no known allergies.  Home Medications   Prior to Admission medications   Medication Sig Start Date End Date Taking? Authorizing Provider  albuterol (PROVENTIL HFA;VENTOLIN HFA) 108 (90 Base) MCG/ACT inhaler Inhale 2 puffs into the lungs every 4 (four) hours as needed for wheezing or shortness of breath. 12/04/15   Lysbeth Penner, FNP  amitriptyline (ELAVIL) 50 MG tablet Take 50 mg by mouth at bedtime.    Historical Provider, MD  benzonatate (TESSALON) 100 MG capsule Take 1 capsule (100 mg total) by mouth 3 (three) times daily as needed for cough. Patient not taking: Reported on 04/11/2016 04/11/16   Katy Apo, NP  chlorpheniramine-HYDROcodone [facility]) 10-8 MG/5ML SUER Take 5 mLs by mouth at bedtime as needed for cough. 04/11/16   Katy Apo, NP  fluticasone (FLONASE) 50 MCG/ACT nasal spray Place 1 spray into both nostrils daily.  07/22/13   Historical Provider, MD  Fluticasone-Salmeterol (ADVAIR) 250-50 MCG/DOSE AEPB Inhale 1 puff into the lungs every 12 (twelve) hours.    Historical Provider, MD  gabapentin (NEURONTIN) 300 MG capsule Take 600 mg by mouth 2 (two) times daily.    Historical Provider, MD  levETIRAcetam (  KEPPRA) 500 MG tablet Take 500 mg by mouth 2 (two) times daily.    Historical Provider, MD  loratadine (CLARITIN) 10 MG tablet Take 10 mg by mouth at bedtime.    Historical Provider, MD  predniSONE (DELTASONE) 10 MG tablet Take 5 tablets (50 mg total) by mouth daily. 04/11/16 04/16/16  Katy Apo, NP  rOPINIRole (REQUIP) 0.25 MG tablet Take 0.25 mg by mouth every evening.    Historical Provider, MD  traMADol (ULTRAM) 50 MG tablet Take 1 tablet (50 mg total) by mouth every 6 (six)  hours as needed. 10/21/15   Konrad Felix, PA   Meds Ordered and Administered this Visit  Medications - No data to display  BP (!) 150/103   Pulse 74   Temp 98.3 F (36.8 C) (Oral)   Resp 18   Ht 4\' 11"  (1.499 m)   Wt 161 lb (73 kg)   LMP 03/28/2016   SpO2 98%   BMI 32.52 kg/m  No data found.   Physical Exam  Constitutional: She is oriented to person, place, and time. She appears well-developed and well-nourished. No distress.  HENT:  Head: Normocephalic and atraumatic.  Right Ear: Hearing, tympanic membrane, external ear and ear canal normal.  Left Ear: Hearing, tympanic membrane, external ear and ear canal normal.  Nose: Nose normal. Right sinus exhibits no maxillary sinus tenderness and no frontal sinus tenderness. Left sinus exhibits no maxillary sinus tenderness and no frontal sinus tenderness.  Mouth/Throat: Uvula is midline and mucous membranes are normal. Posterior oropharyngeal erythema present.  Neck: Normal range of motion. Neck supple.  Cardiovascular: Normal rate, regular rhythm and normal heart sounds.   Pulmonary/Chest: Effort normal. No respiratory distress. She has decreased breath sounds in the right upper field, the right lower field, the left upper field and the left lower field. She has wheezes in the right upper field and the left upper field. She has no rhonchi. She has no rales.  Lymphadenopathy:    She has no cervical adenopathy.  Neurological: She is alert and oriented to person, place, and time.  Skin: Skin is warm and dry. Capillary refill takes less than 2 seconds.  Psychiatric: She has a normal mood and affect. Her behavior is normal. Judgment and thought content normal.    Urgent Care Course   Clinical Course     Procedures (including critical care time)  Labs Review Labs Reviewed - No data to display  Imaging Review No results found.   Visual Acuity Review  Right Eye Distance:   Left Eye Distance:   Bilateral Distance:    Right  Eye Near:   Left Eye Near:    Bilateral Near:         MDM   1. Moderate persistent asthma with exacerbation    Offered breathing treatment today but patient declined. She wants to trial oral medication and continue using her inhalers. Recommend start Prednisone 50mg  daily for 5 days. Use Albuterol inhaler 2 puffs every 6 hours as needed. Continue Advair as directed. Take Tessalon cough pills 1 every 8 hours as needed for cough. May take Tussionex cough syrup 1 teaspoon at night to help with cough. Reviewed [place] Controlled Substance Registry- has current Rx for hydrocodone tablets that she has been filling monthly. No other Rx. Recommend follow-up with your primary care provider in 2 days if not improving or go to ER if symptoms worsen.      Katy Apo, NP 04/12/16 1153

## 2016-04-11 NOTE — ED Notes (Signed)
Zoe Lan, NP aware of BP and approves DC

## 2016-04-11 NOTE — Discharge Instructions (Signed)
Start Prednisone 50mg  daily for 5 days. Use Albuterol inhaler 2 puffs every 6 hours as needed. Continue Advair as directed. Take Tessalon cough pills 1 every 8 hours as needed for cough. May take Tussionex 1 teaspoon at night to help with cough. Follow-up with your primary care provider in 2 days if not improving or go to ER if symptoms worsen.

## 2016-07-12 ENCOUNTER — Encounter (HOSPITAL_COMMUNITY): Payer: Self-pay | Admitting: Emergency Medicine

## 2016-07-12 ENCOUNTER — Ambulatory Visit (HOSPITAL_COMMUNITY)
Admission: EM | Admit: 2016-07-12 | Discharge: 2016-07-12 | Disposition: A | Discharge status: Home / Self Care | Payer: Medicare Other | Attending: Family Medicine | Admitting: Family Medicine

## 2016-07-12 DIAGNOSIS — I10 Essential (primary) hypertension: Secondary | ICD-10-CM | POA: Diagnosis not present

## 2016-07-12 DIAGNOSIS — J069 Acute upper respiratory infection, unspecified: Secondary | ICD-10-CM

## 2016-07-12 DIAGNOSIS — J4520 Mild intermittent asthma, uncomplicated: Secondary | ICD-10-CM

## 2016-07-12 MED ORDER — PREDNISONE 20 MG PO TABS: ORAL_TABLET | ORAL | 0 refills | Status: DC

## 2016-07-12 NOTE — ED Triage Notes (Signed)
See s/s.  Patient reports difficulty breathing last night, sneezing, itchy, watery eyes.  Patient volunteers she has asthma and is concerned for an asthma attack

## 2016-07-12 NOTE — ED Provider Notes (Signed)
[place]    CSN: [id number] Arrival date & time: 07/12/16  1031     History   Chief Complaint Chief Complaint  Patient presents with  . URI    HPI Isabel Shaw is a 45 y.o. female.     Patient reports difficulty breathing last night, sneezing, itchy, watery eyes.  Patient volunteers she has asthma and is concerned for an asthma attack.  She's had no fever shortness of breath but she does have some chest tightness.  Patient's nonsmoker. She works in [employer] (medical). She is off today but has to come back to work tomorrow. She's taken prednisone in the past when she's had an exacerbation of her asthma.      Past Medical History:  Diagnosis Date  . Asthma   . Fibromyalgia   . Seizures [facility])     Patient Active Problem List   Diagnosis Date Noted  . Plantar fasciitis, bilateral 02/24/2015  . 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 Plantar fasciitis of left foot 10/18/2013  . Eschar of foot 09/17/2013  . Foot lesion 09/17/2013  . Bunion of great toe of right foot 05/28/2013  . TTS (tarsal tunnel syndrome) 10/31/2012  . Pain in joint, ankle and foot 08/08/2012    Past Surgical History:  Procedure Laterality Date  . BUNIONECTOMY Right 12/25/2015  . Heel spur resect w/Plantar Fasciotomy Right 09-22-03   Right foot  . NEURECTOMY FOOT Right 01/26/04   Right foot, Plantar calc.  Marland Kitchen Neuroplasty digital n. Right 07/02/07   Right x 3  . TARSAL TUNNEL RELEASE Right 01/26/04   Right foot  . TARSAL TUNNEL RELEASE Right 07/02/07   Right foot    OB History    Gravida Para Term Preterm AB Living   5 4   4 1 4    SAB TAB Ectopic Multiple Live Births   1               Home Medications    Prior to Admission medications   Medication Sig Start Date End Date Taking? Authorizing Provider  albuterol (PROVENTIL HFA;VENTOLIN HFA) 108 (90 Base) MCG/ACT inhaler Inhale 2 puffs into the lungs every 4 (four) hours as needed for wheezing or shortness of breath.  12/04/15  Yes Lysbeth Penner, FNP  amitriptyline (ELAVIL) 50 MG tablet Take 50 mg by mouth at bedtime.   Yes Historical Provider, MD  fluticasone (FLONASE) 50 MCG/ACT nasal spray Place 1 spray into both nostrils daily.  07/22/13  Yes Historical Provider, MD  Fluticasone-Salmeterol (ADVAIR) 250-50 MCG/DOSE AEPB Inhale 1 puff into the lungs every 12 (twelve) hours.   Yes Historical Provider, MD  gabapentin (NEURONTIN) 300 MG capsule Take 600 mg by mouth 2 (two) times daily.   Yes Historical Provider, MD  levETIRAcetam (KEPPRA) 500 MG tablet Take 500 mg by mouth 2 (two) times daily.   Yes Historical Provider, MD  loratadine (CLARITIN) 10 MG tablet Take 10 mg by mouth at bedtime.   Yes Historical Provider, MD  rOPINIRole (REQUIP) 0.25 MG tablet Take 0.25 mg by mouth every evening.   Yes Historical Provider, MD  traMADol (ULTRAM) 50 MG tablet Take 1 tablet (50 mg total) by mouth every 6 (six) hours as needed. 10/21/15  Yes Konrad Felix, PA  benzonatate (TESSALON) 100 MG capsule Take 1 capsule (100 mg total) by mouth 3 (three) times daily as needed for cough. Patient not taking: Reported on 04/11/2016 04/11/16   Katy Apo, NP  chlorpheniramine-HYDROcodone (TUSSIONEX PENNKINETIC ER) 10-8 MG/5ML SUER Take 5 mLs by mouth at bedtime as needed for cough. Patient not taking: Reported on 07/12/2016 04/11/16   Katy Apo, NP  predniSONE (DELTASONE) 20 MG tablet Two daily with food 07/12/16   Robyn Haber, MD    Family History No family history on file.  Social History Social History  Substance Use Topics  . Smoking status: Never Smoker  . Smokeless tobacco: Never Used  . Alcohol use No     Allergies   Patient has no known allergies.   Review of Systems Review of Systems  Constitutional: Negative.   HENT: Positive for congestion and rhinorrhea.   Eyes: Positive for itching.  Respiratory: Positive for cough, chest tightness and wheezing.   Gastrointestinal: Negative.     Neurological: Negative.      Physical Exam Triage Vital Signs ED Triage Vitals [07/12/16 1108]  Enc Vitals Group     BP (!) 149/107     Pulse Rate 72     Resp 18     Temp 98.2 F (36.8 C)     Temp Source Oral     SpO2 99 %     Weight      Height      Head Circumference      Peak Flow      Pain Score      Pain Loc      Pain Edu?      Excl. in [place]?    No data found.   Updated Vital Signs BP (!) 147/104 (BP Location: Left Arm)   Pulse 72   Temp 98.2 F (36.8 C) (Oral)   Resp 18   LMP 06/11/2016   SpO2 99%    Physical Exam  Constitutional: She is oriented to person, place, and time. She appears well-developed and well-nourished.  HENT:  Head: Normocephalic.  Right Ear: External ear normal.  Left Ear: External ear normal.  Mouth/Throat: Oropharynx is clear and moist.  Eyes: Conjunctivae and EOM are normal. Pupils are equal, round, and reactive to light.  Neck: Normal range of motion. Neck supple.  Pulmonary/Chest: Effort normal and breath sounds normal.  Musculoskeletal: Normal range of motion.  Neurological: She is alert and oriented to person, place, and time.  Skin: Skin is warm and dry.  Psychiatric: She has a normal mood and affect.  Nursing note and vitals reviewed.    UC Treatments / Results  Labs (all labs ordered are listed, but only abnormal results are displayed) Labs Reviewed - No data to display  EKG  EKG Interpretation None       Radiology No results found.  Procedures Procedures (including critical care time)  Medications Ordered in UC Medications - No data to display   Initial Impression / Assessment and Plan / UC Course  I have reviewed the triage vital signs and the nursing notes.  Pertinent labs & imaging results that were available during my care of the patient were reviewed by me and considered in my medical decision making (see chart for details).     Final Clinical Impressions(s) / UC Diagnoses   Final diagnoses:   Upper respiratory tract infection, unspecified type  Essential hypertension  Mild intermittent asthma without complication    New Prescriptions New Prescriptions   PREDNISONE (DELTASONE) 20 MG TABLET    Two daily with food     Robyn Haber, MD 07/12/16 1123

## 2016-08-10 ENCOUNTER — Ambulatory Visit (HOSPITAL_COMMUNITY)
Admission: EM | Admit: 2016-08-10 | Discharge: 2016-08-10 | Disposition: A | Discharge status: Home / Self Care | Payer: Medicare Other | Attending: Family Medicine | Admitting: Family Medicine

## 2016-08-10 ENCOUNTER — Encounter (HOSPITAL_COMMUNITY): Payer: Self-pay | Admitting: Emergency Medicine

## 2016-08-10 DIAGNOSIS — M76899 Other specified enthesopathies of unspecified lower limb, excluding foot: Secondary | ICD-10-CM | POA: Diagnosis not present

## 2016-08-10 DIAGNOSIS — M7122 Synovial cyst of popliteal space [Baker], left knee: Secondary | ICD-10-CM

## 2016-08-10 MED ORDER — NAPROXEN 375 MG PO TABS: 375.0000 mg | ORAL_TABLET | Freq: Two times a day (BID) | ORAL | 0 refills | Status: DC

## 2016-08-10 NOTE — Discharge Instructions (Signed)
Read the instructions accompanying these pages regarding tendinitis in the back of the knee and a Baker cyst. For the tendinitis you may apply cold compresses  up to 3 times a day. Wear the knee sleeve as needed for the next several days. Take the anti-inflammatory medicine as directed. And follow-up with the orthopedic surgeon listed on the page above.

## 2016-08-10 NOTE — ED Triage Notes (Signed)
Pain behind left knee with weight bearing and walking.  Pain sometimes resolves with activity.  Pain is the worse when she "gets in bed and fold leg"

## 2016-08-10 NOTE — ED Provider Notes (Signed)
CSN: [id number]     Arrival date & time 08/10/16  1311 History   First MD Initiated Contact with Patient 08/10/16 1432     Chief Complaint  Patient presents with  . Leg Pain   (Consider location/radiation/quality/duration/timing/severity/associated sxs/prior Treatment) 46 year old female complaining of posterior left knee pain for about 3 weeks. The pain is isolated to the popliteal and immediately superior for a few centimeters. Denies any known injury. No trauma. She has a history of fibromyalgia. It is worse when lying down and flexing the knee. It is painful with ambulation.      Past Medical History:  Diagnosis Date  . Asthma   . Fibromyalgia   . Seizures ([place])    Past Surgical History:  Procedure Laterality Date  . BUNIONECTOMY Right 12/25/2015  . Heel spur resect w/Plantar Fasciotomy Right 09-22-03   Right foot  . NEURECTOMY FOOT Right 01/26/04   Right foot, Plantar calc.  Marland Kitchen Neuroplasty digital n. Right 07/02/07   Right x 3  . TARSAL TUNNEL RELEASE Right 01/26/04   Right foot  . TARSAL TUNNEL RELEASE Right 07/02/07   Right foot   No family history on file. Social History  Substance Use Topics  . Smoking status: Never Smoker  . Smokeless tobacco: Never Used  . Alcohol use No   OB History    Gravida Para Term Preterm AB Living   5 4   4 1 4    SAB TAB Ectopic Multiple Live Births   1             Review of Systems  Constitutional: Positive for activity change. Negative for chills and fever.  HENT: Negative.   Respiratory: Negative.   Cardiovascular: Negative.   Musculoskeletal:       As per HPI  Skin: Negative for color change, pallor and rash.  Neurological: Negative.   All other systems reviewed and are negative.   Allergies  Patient has no known allergies.  Home Medications   Prior to Admission medications   Medication Sig Start Date End Date Taking? Authorizing Provider  albuterol (PROVENTIL HFA;VENTOLIN HFA) 108 (90 Base) MCG/ACT inhaler Inhale 2  puffs into the lungs every 4 (four) hours as needed for wheezing or shortness of breath. 12/04/15   Lysbeth Penner, FNP  amitriptyline (ELAVIL) 50 MG tablet Take 50 mg by mouth at bedtime.    Historical Provider, MD  fluticasone (FLONASE) 50 MCG/ACT nasal spray Place 1 spray into both nostrils daily.  07/22/13   Historical Provider, MD  Fluticasone-Salmeterol (ADVAIR) 250-50 MCG/DOSE AEPB Inhale 1 puff into the lungs every 12 (twelve) hours.    Historical Provider, MD  gabapentin (NEURONTIN) 300 MG capsule Take 600 mg by mouth 2 (two) times daily.    Historical Provider, MD  levETIRAcetam (KEPPRA) 500 MG tablet Take 500 mg by mouth 2 (two) times daily.    Historical Provider, MD  loratadine (CLARITIN) 10 MG tablet Take 10 mg by mouth at bedtime.    Historical Provider, MD  naproxen (NAPROSYN) 375 MG tablet Take 1 tablet (375 mg total) by mouth 2 (two) times daily. 08/10/16   Janne Napoleon, NP  rOPINIRole (REQUIP) 0.25 MG tablet Take 0.25 mg by mouth every evening.    Historical Provider, MD  traMADol (ULTRAM) 50 MG tablet Take 1 tablet (50 mg total) by mouth every 6 (six) hours as needed. 10/21/15   Konrad Felix, PA   Meds Ordered and Administered this Visit  Medications - No data to display  BP (!) 144/96 (BP Location: Right Arm)   Pulse 72   Temp 98.9 F (37.2 C) (Oral)   Resp 18   LMP 08/03/2016   SpO2 100%  No data found.   Physical Exam  Constitutional: She is oriented to person, place, and time. She appears well-developed and well-nourished. No distress.  HENT:  Head: Normocephalic and atraumatic.  Eyes: EOM are normal.  Neck: Neck supple.  Cardiovascular: Normal rate.   Pulmonary/Chest: Effort normal.  Musculoskeletal:  No swelling or tenderness to the anterior, medial lateral aspect of the left knee. There is tenderness over the popliteal tendons. Having the patient flex the knee also produces tenderness in the popliteal space. Examining the posterior knee while the patient  standing reveals a Baker cyst in the affected knee. This is also tender. No swelling or tenderness to the calf muscle or distally.  Lymphadenopathy:    She has no cervical adenopathy.  Neurological: She is alert and oriented to person, place, and time. No cranial nerve deficit.  Skin: Skin is warm and dry.  Psychiatric: She has a normal mood and affect.  Nursing note and vitals reviewed.   Urgent Care Course     Procedures (including critical care time)  Labs Review Labs Reviewed - No data to display  Imaging Review No results found.   Visual Acuity Review  Right Eye Distance:   Left Eye Distance:   Bilateral Distance:    Right Eye Near:   Left Eye Near:    Bilateral Near:         MDM   1. Hamstring tendinitis   2. Baker's cyst of knee, left    Read the instructions accompanying these pages regarding tendinitis in the back of the knee and a Baker cyst. For the tendinitis you may apply cold compresses  up to 3 times a day. Wear the knee sleeve as needed for the next several days. Take the anti-inflammatory medicine as directed. And follow-up with the orthopedic surgeon listed on the page above. Meds ordered this encounter  Medications  . naproxen (NAPROSYN) 375 MG tablet    Sig: Take 1 tablet (375 mg total) by mouth 2 (two) times daily.    Dispense:  20 tablet    Refill:  0    Order Specific Question:   Supervising Provider    Answer:   Robyn Haber [[id number]]          Janne Napoleon, NP 08/10/16 1521

## 2016-09-13 ENCOUNTER — Telehealth: Payer: Self-pay | Admitting: *Deleted

## 2016-09-13 NOTE — Telephone Encounter (Signed)
Called patient informed her to take motrin or tylenol for pain for the next couple days. Patient was supposed to follow up with our office in December 2018 but did not. She has been scheduled for appointment on May 14. I have advised patient to follow up with our office.

## 2016-09-13 NOTE — Telephone Encounter (Signed)
Pt left message yesterday stating that she is having bad cramps from her fibroid. She requests Rx for pain to be sent to her pharmacy.

## 2016-09-26 ENCOUNTER — Ambulatory Visit (INDEPENDENT_AMBULATORY_CARE_PROVIDER_SITE_OTHER): Payer: Medicare Other | Admitting: Family Medicine

## 2016-09-26 ENCOUNTER — Encounter: Payer: Self-pay | Admitting: Family Medicine

## 2016-09-26 DIAGNOSIS — D252 Subserosal leiomyoma of uterus: Secondary | ICD-10-CM | POA: Diagnosis not present

## 2016-09-26 DIAGNOSIS — N946 Dysmenorrhea, unspecified: Secondary | ICD-10-CM

## 2016-09-26 DIAGNOSIS — I10 Essential (primary) hypertension: Secondary | ICD-10-CM | POA: Insufficient documentation

## 2016-09-26 DIAGNOSIS — D251 Intramural leiomyoma of uterus: Secondary | ICD-10-CM | POA: Diagnosis not present

## 2016-09-26 DIAGNOSIS — D219 Benign neoplasm of connective and other soft tissue, unspecified: Secondary | ICD-10-CM | POA: Insufficient documentation

## 2016-09-26 DIAGNOSIS — R030 Elevated blood-pressure reading, without diagnosis of hypertension: Secondary | ICD-10-CM | POA: Diagnosis not present

## 2016-09-26 NOTE — Assessment & Plan Note (Signed)
Consider repeat u/s next visit

## 2016-09-26 NOTE — Progress Notes (Signed)
   Subjective:    Patient ID: RAELA BOHL is a 46 y.o. female presenting with Follow-up  on 09/26/2016  HPI: Reports severe cramping with Megace--reports normal cycle without the medicine. Has known fibroids BP is up - ate pork yesterday Reports pain with sex  Review of Systems  Constitutional: Negative for chills and fever.  Respiratory: Negative for shortness of breath.   Cardiovascular: Negative for chest pain.  Gastrointestinal: Negative for abdominal pain, nausea and vomiting.  Genitourinary: Negative for dysuria.  Skin: Negative for rash.      Objective:    BP (!) 168/110   Pulse 65   Ht 4\' 11"  (1.499 m)   Wt 162 lb (73.5 kg)   LMP 09/13/2016 (Within Days)   BMI 32.72 kg/m  Physical Exam  Constitutional: She is oriented to person, place, and time. She appears well-developed and well-nourished. No distress.  HENT:  Head: Normocephalic and atraumatic.  Eyes: No scleral icterus.  Neck: Neck supple.  Cardiovascular: Normal rate.   Pulmonary/Chest: Effort normal.  Abdominal: Soft.  Neurological: She is alert and oriented to person, place, and time.  Skin: Skin is warm and dry.  Psychiatric: She has a normal mood and affect.        Assessment & Plan:   Problem List Items Addressed This Visit      Unprioritized   Fibroid    Consider repeat u/s next visit      Dysmenorrhea    Trial of stopping Megace. If continues to have no cycle, let me know.      Elevated blood pressure reading    Dash diet, information given.       Pap due next visit.  Total face-to-face time with patient: 15 minutes. Over 50% of encounter was spent on counseling and coordination of care. Return in about 3 months (around 12/27/2016) for a follow-up, repeat pap.  [name] 09/26/2016 9:36 AM

## 2016-09-26 NOTE — Assessment & Plan Note (Signed)
Trial of stopping Megace. If continues to have no cycle, let me know.

## 2016-09-26 NOTE — Assessment & Plan Note (Signed)
Dash diet, information given.

## 2016-09-26 NOTE — Patient Instructions (Signed)
Uterine Fibroids Uterine fibroids are tissue masses (tumors) that can develop in the womb (uterus). They are also called leiomyomas. This type of tumor is not cancerous (benign) and does not spread to other parts of the body outside of the pelvic area, which is between the hip bones. Occasionally, fibroids may develop in the fallopian tubes, in the cervix, or on the support structures (ligaments) that surround the uterus. You can have one or many fibroids. Fibroids can vary in size, weight, and where they grow in the uterus. Some can become quite large. Most fibroids do not require medical treatment. What are the causes? A fibroid can develop when a single uterine cell keeps growing (replicating). Most cells in the human body have a control mechanism that keeps them from replicating without control. What are the signs or symptoms? Symptoms may include:  Heavy bleeding during your period.  Bleeding or spotting between periods.  Pelvic pain and pressure.  Bladder problems, such as needing to urinate more often (urinary frequency) or urgently.  Inability to reproduce offspring (infertility).  Miscarriages. How is this diagnosed? Uterine fibroids are diagnosed through a physical exam. Your health care provider may feel the lumpy tumors during a pelvic exam. Ultrasonography and an MRI may be done to determine the size, location, and number of fibroids. How is this treated? Treatment may include:  Watchful waiting. This involves getting the fibroid checked by your health care provider to see if it grows or shrinks. Follow your health care provider's recommendations for how often to have this checked.  Hormone medicines. These can be taken by mouth or given through an intrauterine device (IUD).  Surgery.  Removing the fibroids (myomectomy) or the uterus (hysterectomy).  Removing blood supply to the fibroids (uterine artery embolization). If fibroids interfere with your fertility and you  want to become pregnant, your health care provider may recommend having the fibroids removed. Follow these instructions at home:  Keep all follow-up visits as directed by your health care provider. This is important.  Take over-the-counter and prescription medicines only as told by your health care provider.  If you were prescribed a hormone treatment, take the hormone medicines exactly as directed.  Ask your health care provider about taking iron pills and increasing the amount of dark green, leafy vegetables in your diet. These actions can help to boost your blood iron levels, which may be affected by heavy menstrual bleeding.  Pay close attention to your period and tell your health care provider about any changes, such as:  Increased blood flow that requires you to use more pads or tampons than usual per month.  A change in the number of days that your period lasts per month.  A change in symptoms that are associated with your period, such as abdominal cramping or back pain. Contact a health care provider if:  You have pelvic pain, back pain, or abdominal cramps that cannot be controlled with medicines.  You have an increase in bleeding between and during periods.  You soak tampons or pads in a half hour or less.  You feel lightheaded, extra tired, or weak. Get help right away if:  You faint.  You have a sudden increase in pelvic pain. This information is not intended to replace advice given to you by your health care provider. Make sure you discuss any questions you have with your health care provider. Document Released: 04/29/2000 Document Revised: 12/31/2015 Document Reviewed: 10/29/2013 Elsevier Interactive Patient Education  2017 [employer]  DASH stands for "Dietary Approaches to Stop Hypertension." The DASH eating plan is a healthy eating plan that has been shown to reduce high blood pressure (hypertension). It may also reduce your risk for type 2  diabetes, heart disease, and stroke. The DASH eating plan may also help with weight loss. What are tips for following this plan? General guidelines   Avoid eating more than 2,300 mg (milligrams) of salt (sodium) a day. If you have hypertension, you may need to reduce your sodium intake to 1,500 mg a day.  Limit alcohol intake to no more than 1 drink a day for nonpregnant women and 2 drinks a day for men. One drink equals 12 oz of beer, 5 oz of wine, or 1 oz of hard liquor.  Work with your health care provider to maintain a healthy body weight or to lose weight. Ask what an ideal weight is for you.  Get at least 30 minutes of exercise that causes your heart to beat faster (aerobic exercise) most days of the week. Activities may include walking, swimming, or biking.  Work with your health care provider or diet and nutrition specialist (dietitian) to adjust your eating plan to your individual calorie needs. Reading food labels   Check food labels for the amount of sodium per serving. Choose foods with less than 5 percent of the Daily Value of sodium. Generally, foods with less than 300 mg of sodium per serving fit into this eating plan.  To find whole grains, look for the word "whole" as the first word in the ingredient list. Shopping   Buy products labeled as "low-sodium" or "no salt added."  Buy fresh foods. Avoid canned foods and premade or frozen meals. Cooking   Avoid adding salt when cooking. Use salt-free seasonings or herbs instead of table salt or sea salt. Check with your health care provider or pharmacist before using salt substitutes.  Do not fry foods. Cook foods using healthy methods such as baking, boiling, grilling, and broiling instead.  Cook with heart-healthy oils, such as olive, canola, soybean, or sunflower oil. Meal planning    Eat a balanced diet that includes:  5 or more servings of fruits and vegetables each day. At each meal, try to fill half of your plate  with fruits and vegetables.  Up to 6-8 servings of whole grains each day.  Less than 6 oz of lean meat, poultry, or fish each day. A 3-oz serving of meat is about the same size as a deck of cards. One egg equals 1 oz.  2 servings of low-fat dairy each day.  A serving of nuts, seeds, or beans 5 times each week.  Heart-healthy fats. Healthy fats called Omega-3 fatty acids are found in foods such as flaxseeds and coldwater fish, like sardines, salmon, and mackerel.  Limit how much you eat of the following:  Canned or prepackaged foods.  Food that is high in trans fat, such as fried foods.  Food that is high in saturated fat, such as fatty meat.  Sweets, desserts, sugary drinks, and other foods with added sugar.  Full-fat dairy products.  Do not salt foods before eating.  Try to eat at least 2 vegetarian meals each week.  Eat more home-cooked food and less restaurant, buffet, and fast food.  When eating at a restaurant, ask that your food be prepared with less salt or no salt, if possible. What foods are recommended? The items listed may not be a complete list. Talk with your  dietitian about what dietary choices are best for you. Grains  Whole-grain or whole-wheat bread. Whole-grain or whole-wheat pasta. Brown rice. [place]. Bulgur. Whole-grain and low-sodium cereals. Pita bread. Low-fat, low-sodium crackers. Whole-wheat flour tortillas. Vegetables  Fresh or frozen vegetables (raw, steamed, roasted, or grilled). Low-sodium or reduced-sodium tomato and vegetable juice. Low-sodium or reduced-sodium tomato sauce and tomato paste. Low-sodium or reduced-sodium canned vegetables. Fruits  All fresh, dried, or frozen fruit. Canned fruit in natural juice (without added sugar). Meat and other protein foods  Skinless chicken or [place]. Ground chicken or [place]. Pork with fat trimmed off. Fish and seafood. Egg whites. Dried beans, peas, or lentils. Unsalted nuts, nut butters, and  seeds. Unsalted canned beans. Lean cuts of beef with fat trimmed off. Low-sodium, lean deli meat. Dairy  Low-fat (1%) or fat-free (skim) milk. Fat-free, low-fat, or reduced-fat cheeses. Nonfat, low-sodium ricotta or cottage cheese. Low-fat or nonfat yogurt. Low-fat, low-sodium cheese. Fats and oils  Soft margarine without trans fats. Vegetable oil. Low-fat, reduced-fat, or light mayonnaise and salad dressings (reduced-sodium). Canola, safflower, olive, soybean, and sunflower oils. Avocado. Seasoning and other foods  Herbs. Spices. Seasoning mixes without salt. Unsalted popcorn and pretzels. Fat-free sweets. What foods are not recommended? The items listed may not be a complete list. Talk with your dietitian about what dietary choices are best for you. Grains  Baked goods made with fat, such as croissants, muffins, or some breads. Dry pasta or rice meal packs. Vegetables  Creamed or fried vegetables. Vegetables in a cheese sauce. Regular canned vegetables (not low-sodium or reduced-sodium). Regular canned tomato sauce and paste (not low-sodium or reduced-sodium). Regular tomato and vegetable juice (not low-sodium or reduced-sodium). Angie Fava. Olives. Fruits  Canned fruit in a light or heavy syrup. Fried fruit. Fruit in cream or butter sauce. Meat and other protein foods  Fatty cuts of meat. Ribs. Fried meat. Berniece Salines. Sausage. Bologna and other processed lunch meats. Salami. Fatback. Hotdogs. Bratwurst. Salted nuts and seeds. Canned beans with added salt. Canned or smoked fish. Whole eggs or egg yolks. Chicken or [place] with skin. Dairy  Whole or 2% milk, cream, and half-and-half. Whole or full-fat cream cheese. Whole-fat or sweetened yogurt. Full-fat cheese. Nondairy creamers. Whipped toppings. Processed cheese and cheese spreads. Fats and oils  Butter. Stick margarine. Lard. Shortening. Ghee. Bacon fat. Tropical oils, such as coconut, palm kernel, or palm oil. Seasoning and other foods  Salted  popcorn and pretzels. Onion salt, garlic salt, seasoned salt, table salt, and sea salt. Worcestershire sauce. Tartar sauce. Barbecue sauce. Teriyaki sauce. Soy sauce, including reduced-sodium. Steak sauce. Canned and packaged gravies. Fish sauce. Oyster sauce. Cocktail sauce. Horseradish that you find on the shelf. Ketchup. Mustard. Meat flavorings and tenderizers. Bouillon cubes. Hot sauce and Tabasco sauce. Premade or packaged marinades. Premade or packaged taco seasonings. Relishes. Regular salad dressings. Where to find more information:  National Heart, Lung, and [place]: https://wilson-eaton.com/  American Heart Association: www.heart.org Summary  The DASH eating plan is a healthy eating plan that has been shown to reduce high blood pressure (hypertension). It may also reduce your risk for type 2 diabetes, heart disease, and stroke.  With the DASH eating plan, you should limit salt (sodium) intake to 2,300 mg a day. If you have hypertension, you may need to reduce your sodium intake to 1,500 mg a day.  When on the DASH eating plan, aim to eat more fresh fruits and vegetables, whole grains, lean proteins, low-fat dairy, and heart-healthy fats.  Work with your  health care provider or diet and nutrition specialist (dietitian) to adjust your eating plan to your individual calorie needs. This information is not intended to replace advice given to you by your health care provider. Make sure you discuss any questions you have with your health care provider. Document Released: 04/21/2011 Document Revised: 04/25/2016 Document Reviewed: 04/25/2016 Elsevier Interactive Patient Education  2017 [employer].

## 2016-09-29 IMAGING — DX DG CHEST 2V
2 series · 2 of 2 positions shown · non-contrast
Comparison: 11/20/2009

CLINICAL DATA: Chest pain and shortness of breath for 2 days.
Cough.

EXAM:
CHEST  2 VIEW

[w chest pa]
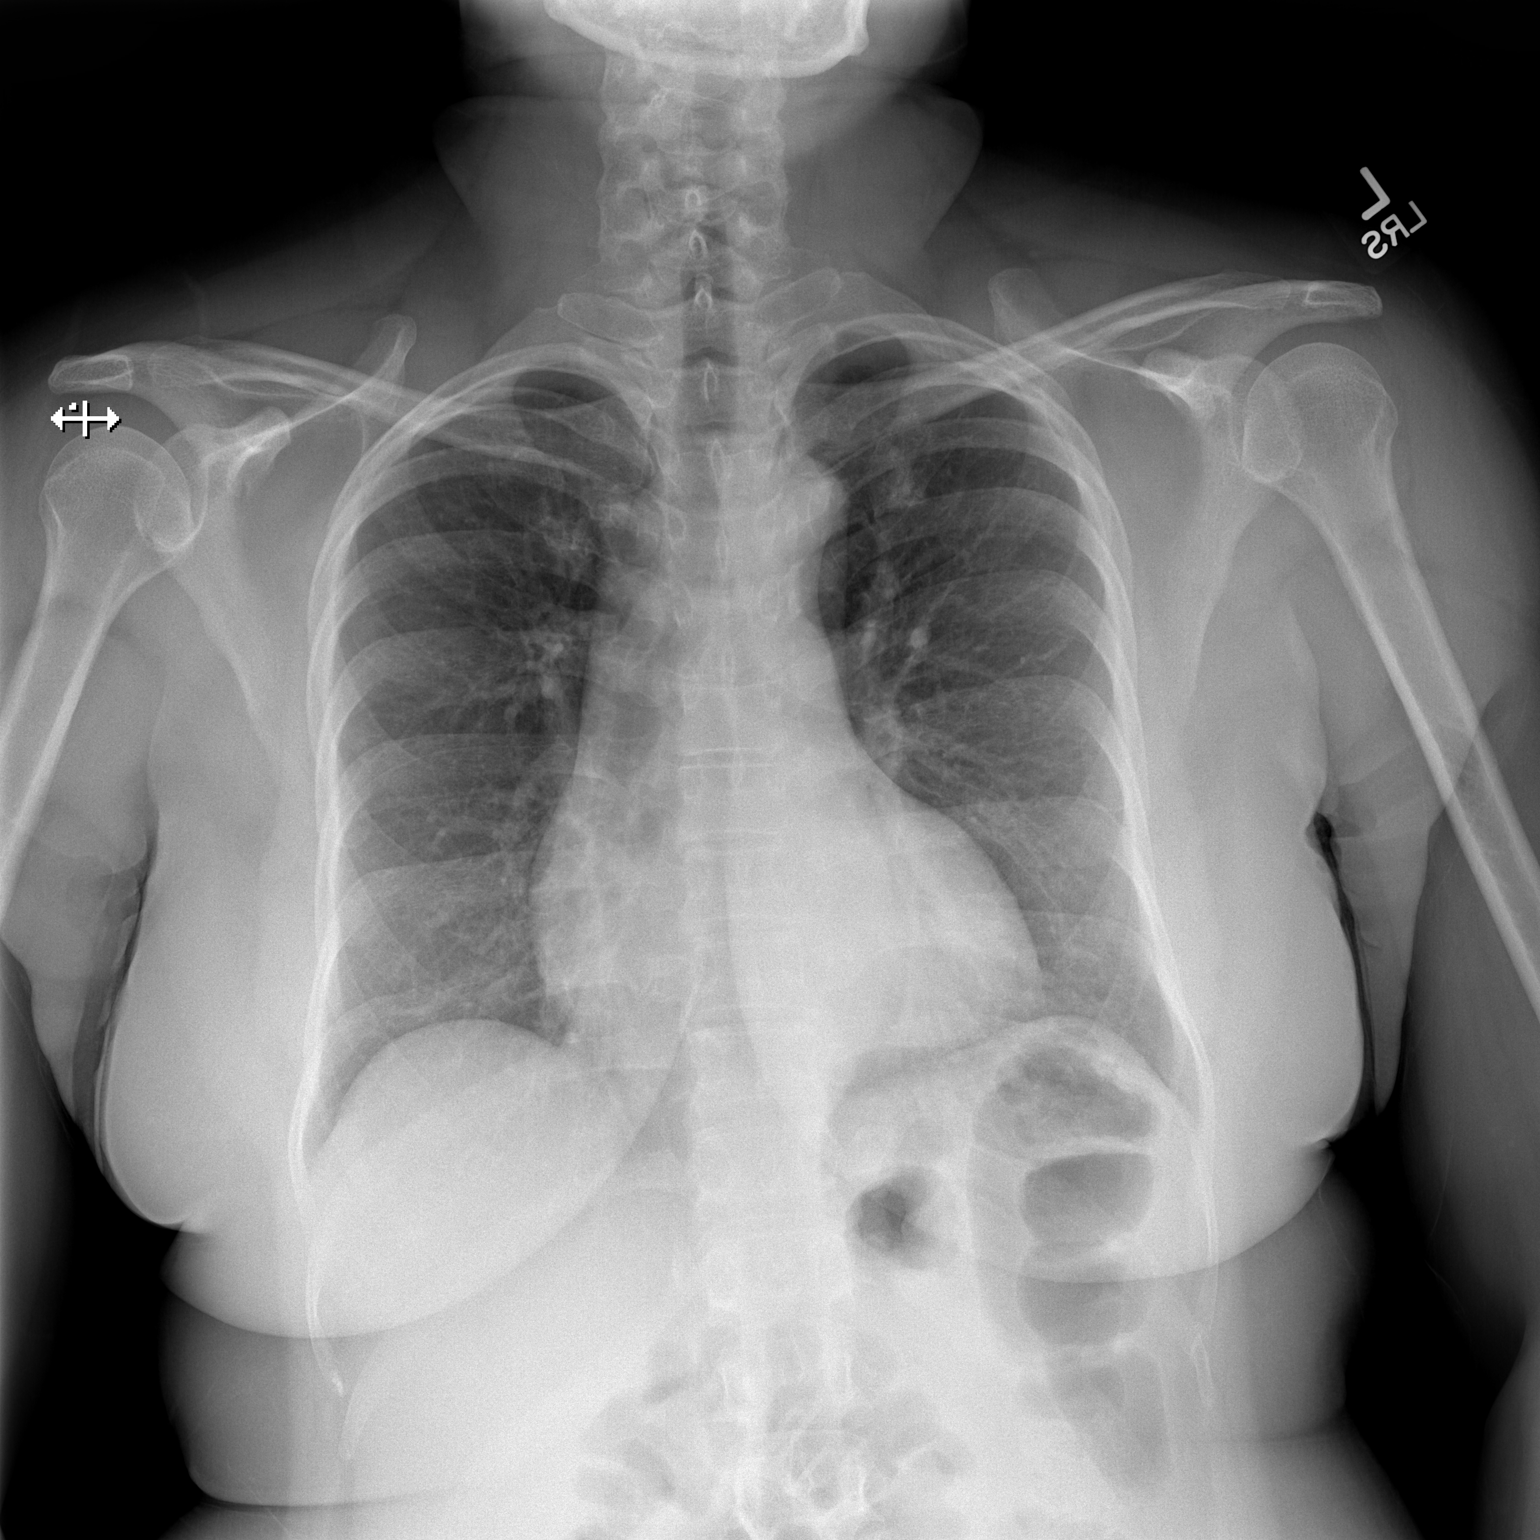

[w chest lat]
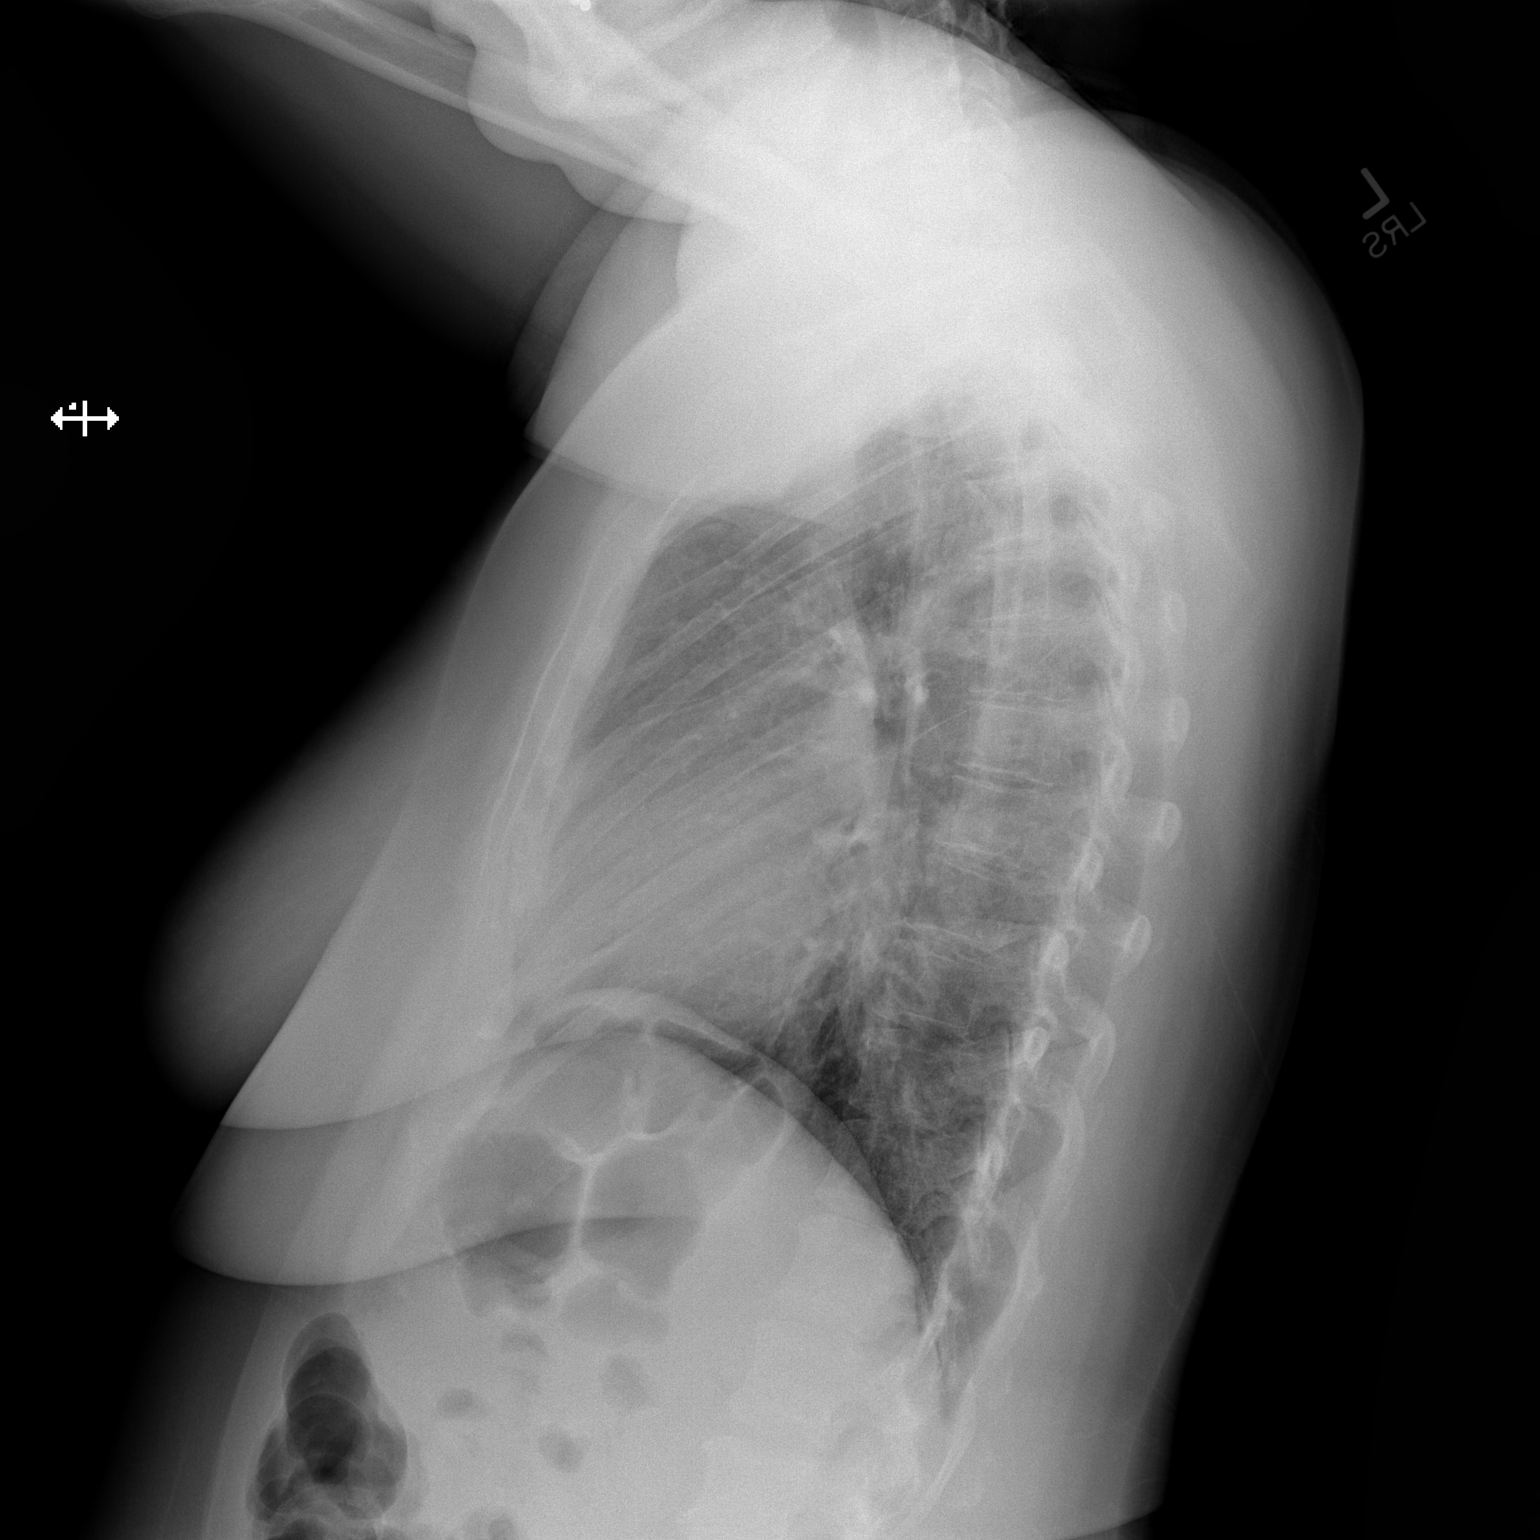

[2 of 2 positions shown; findings below may reference images not displayed]

FINDINGS: The cardiomediastinal contours are normal. The lungs are clear.
Pulmonary vasculature is normal. No consolidation, pleural effusion,
or pneumothorax. No acute osseous abnormalities are seen.
IMPRESSION: No acute pulmonary process.

## 2017-01-04 ENCOUNTER — Ambulatory Visit (INDEPENDENT_AMBULATORY_CARE_PROVIDER_SITE_OTHER): Payer: Medicare Other | Admitting: Family Medicine

## 2017-01-04 VITALS — BP 130/96 | HR 86 | Ht 59.0 in | Wt 156.4 lb

## 2017-01-04 DIAGNOSIS — D251 Intramural leiomyoma of uterus: Secondary | ICD-10-CM | POA: Diagnosis not present

## 2017-01-04 DIAGNOSIS — J4530 Mild persistent asthma, uncomplicated: Secondary | ICD-10-CM | POA: Insufficient documentation

## 2017-01-04 DIAGNOSIS — G2581 Restless legs syndrome: Secondary | ICD-10-CM | POA: Insufficient documentation

## 2017-01-04 DIAGNOSIS — D252 Subserosal leiomyoma of uterus: Secondary | ICD-10-CM | POA: Diagnosis not present

## 2017-01-04 DIAGNOSIS — M797 Fibromyalgia: Secondary | ICD-10-CM | POA: Insufficient documentation

## 2017-01-04 DIAGNOSIS — N946 Dysmenorrhea, unspecified: Secondary | ICD-10-CM | POA: Diagnosis not present

## 2017-01-04 DIAGNOSIS — J309 Allergic rhinitis, unspecified: Secondary | ICD-10-CM | POA: Insufficient documentation

## 2017-01-04 MED ORDER — NAPROXEN 500 MG PO TBEC: 500.0000 mg | DELAYED_RELEASE_TABLET | Freq: Two times a day (BID) | ORAL | 2 refills | Status: DC

## 2017-01-04 NOTE — Assessment & Plan Note (Signed)
Possibly mittleschmertz. Trial of NSAID if needed.

## 2017-01-04 NOTE — Assessment & Plan Note (Signed)
Small fibroid < 2 cm--f/u prn.--cycles are normal.

## 2017-01-04 NOTE — Progress Notes (Signed)
   Subjective:    Patient ID: Isabel Shaw is a 46 y.o. female presenting with Follow-up  on 01/04/2017  HPI: Feels like in labor every 2-3 months. Not associated with her menses. But occurs for 2-3 days in 2 wks after menses. Always on her left side. Off her Megace. Cycles are normal. Her weight is down around 10-15 pounds.  She has started HCTZ for her BP. Had pap earlier this year at [facility] which was normal. Mammogram through [facility].  Review of Systems  Constitutional: Negative for chills and fever.  Respiratory: Negative for shortness of breath.   Cardiovascular: Negative for chest pain.  Gastrointestinal: Negative for abdominal pain, nausea and vomiting.  Genitourinary: Negative for dysuria.  Skin: Negative for rash.      Objective:    BP (!) 130/96   Pulse 86   Ht 4\' 11"  (1.499 m)   Wt 156 lb 6.4 oz (70.9 kg)   LMP 12/21/2016   BMI 31.59 kg/m  Physical Exam  Constitutional: She is oriented to person, place, and time. She appears well-developed and well-nourished. No distress.  HENT:  Head: Normocephalic and atraumatic.  Eyes: No scleral icterus.  Neck: Neck supple.  Cardiovascular: Normal rate.   Pulmonary/Chest: Effort normal.  Abdominal: Soft.  Neurological: She is alert and oriented to person, place, and time.  Skin: Skin is warm and dry.  Psychiatric: She has a normal mood and affect.      Assessment & Plan:   Problem List Items Addressed This Visit      Unprioritized   Fibroid - Primary    Small fibroid < 2 cm--f/u prn.--cycles are normal.      Dysmenorrhea    Possibly mittleschmertz. Trial of NSAID if needed.      Relevant Medications   naproxen (NAPROXEN DR) 500 MG EC tablet      Total face-to-face time with patient: 15 minutes. Over 50% of encounter was spent on counseling and coordination of care. Return if symptoms worsen or fail to improve.  Isabel Shaw 01/04/2017 2:00 PM

## 2017-01-04 NOTE — Patient Instructions (Signed)
Dysmenorrhea Menstrual cramps (dysmenorrhea) are caused by the muscles of the uterus tightening (contracting) during a menstrual period. For some women, this discomfort is merely bothersome. For others, dysmenorrhea can be severe enough to interfere with everyday activities for a few days each month. Primary dysmenorrhea is menstrual cramps that last a couple of days when you start having menstrual periods or soon after. This often begins after a teenager starts having her period. As a woman gets older or has a baby, the cramps will usually lessen or disappear. Secondary dysmenorrhea begins later in life, lasts longer, and the pain may be stronger than primary dysmenorrhea. The pain may start before the period and last a few days after the period. What are the causes? Dysmenorrhea is usually caused by an underlying problem, such as:  The tissue lining the uterus grows outside of the uterus in other areas of the body (endometriosis).  The endometrial tissue, which normally lines the uterus, is found in or grows into the muscular walls of the uterus (adenomyosis).  The pelvic blood vessels are engorged with blood just before the menstrual period (pelvic congestive syndrome).  Overgrowth of cells (polyps) in the lining of the uterus or cervix.  Falling down of the uterus (prolapse) because of loose or stretched ligaments.  Depression.  Bladder problems, infection, or inflammation.  Problems with the intestine, a tumor, or irritable bowel syndrome.  Cancer of the female organs or bladder.  A severely tipped uterus.  A very tight opening or closed cervix.  Noncancerous tumors of the uterus (fibroids).  Pelvic inflammatory disease (PID).  Pelvic scarring (adhesions) from a previous surgery.  Ovarian cyst.  An intrauterine device (IUD) used for birth control.  What increases the risk? You may be at greater risk of dysmenorrhea if:  You are younger than age 44.  You started puberty  early.  You have irregular or heavy bleeding.  You have never given birth.  You have a family history of this problem.  You are a smoker.  What are the signs or symptoms?  Cramping or throbbing pain in your lower abdomen.  Headaches.  Lower back pain.  Nausea or vomiting.  Diarrhea.  Sweating or dizziness.  Loose stools. How is this diagnosed? A diagnosis is based on your history, symptoms, physical exam, diagnostic tests, or procedures. Diagnostic tests or procedures may include:  Blood tests.  Ultrasonography.  An examination of the lining of the uterus (dilation and curettage, D&C).  An examination inside your abdomen or pelvis with a scope (laparoscopy).  X-rays.  CT scan.  MRI.  An examination inside the bladder with a scope (cystoscopy).  An examination inside the intestine or stomach with a scope (colonoscopy, gastroscopy).  How is this treated? Treatment depends on the cause of the dysmenorrhea. Treatment may include:  Pain medicine prescribed by your health care provider.  Birth control pills or an IUD with progesterone hormone in it.  Hormone replacement therapy.  Nonsteroidal anti-inflammatory drugs (NSAIDs). These may help stop the production of prostaglandins.  Surgery to remove adhesions, endometriosis, ovarian cyst, or fibroids.  Removal of the uterus (hysterectomy).  Progesterone shots to stop the menstrual period.  Cutting the nerves on the sacrum that go to the female organs (presacral neurectomy).  Electric current to the sacral nerves (sacral nerve stimulation).  Antidepressant medicine.  Psychiatric therapy, counseling, or group therapy.  Exercise and physical therapy.  Meditation and yoga therapy.  Acupuncture.  Follow these instructions at home:  Only take over-the-counter or  prescription medicines as directed by your health care provider.  Place a heating pad or hot water bottle on your lower back or abdomen. Do  not sleep with the heating pad.  Use aerobic exercises, walking, swimming, biking, and other exercises to help lessen the cramping.  Massage to the lower back or abdomen may help.  Stop smoking.  Avoid alcohol and caffeine. Contact a health care provider if:  Your pain does not get better with medicine.  You have pain with sexual intercourse.  Your pain increases and is not controlled with medicines.  You have abnormal vaginal bleeding with your period.  You develop nausea or vomiting with your period that is not controlled with medicine. Get help right away if: You pass out. This information is not intended to replace advice given to you by your health care provider. Make sure you discuss any questions you have with your health care provider. Document Released: 05/02/2005 Document Revised: 10/08/2015 Document Reviewed: 10/18/2012 Elsevier Interactive Patient Education  2017 [place] DASH stands for "Dietary Approaches to Stop Hypertension." The DASH eating plan is a healthy eating plan that has been shown to reduce high blood pressure (hypertension). It may also reduce your risk for type 2 diabetes, heart disease, and stroke. The DASH eating plan may also help with weight loss. What are tips for following this plan? General guidelines  Avoid eating more than 2,300 mg (milligrams) of salt (sodium) a day. If you have hypertension, you may need to reduce your sodium intake to 1,500 mg a day.  Limit alcohol intake to no more than 1 drink a day for nonpregnant women and 2 drinks a day for men. One drink equals 12 oz of beer, 5 oz of wine, or 1 oz of hard liquor.  Work with your health care provider to maintain a healthy body weight or to lose weight. Ask what an ideal weight is for you.  Get at least 30 minutes of exercise that causes your heart to beat faster (aerobic exercise) most days of the week. Activities may include walking, swimming, or biking.  Work  with your health care provider or diet and nutrition specialist (dietitian) to adjust your eating plan to your individual calorie needs. Reading food labels  Check food labels for the amount of sodium per serving. Choose foods with less than 5 percent of the Daily Value of sodium. Generally, foods with less than 300 mg of sodium per serving fit into this eating plan.  To find whole grains, look for the word "whole" as the first word in the ingredient list. Shopping  Buy products labeled as "low-sodium" or "no salt added."  Buy fresh foods. Avoid canned foods and premade or frozen meals. Cooking  Avoid adding salt when cooking. Use salt-free seasonings or herbs instead of table salt or sea salt. Check with your health care provider or pharmacist before using salt substitutes.  Do not fry foods. Cook foods using healthy methods such as baking, boiling, grilling, and broiling instead.  Cook with heart-healthy oils, such as olive, canola, soybean, or sunflower oil. Meal planning   Eat a balanced diet that includes: ? 5 or more servings of fruits and vegetables each day. At each meal, try to fill half of your plate with fruits and vegetables. ? Up to 6-8 servings of whole grains each day. ? Less than 6 oz of lean meat, poultry, or fish each day. A 3-oz serving of meat is about the same size as  a deck of cards. One egg equals 1 oz. ? 2 servings of low-fat dairy each day. ? A serving of nuts, seeds, or beans 5 times each week. ? Heart-healthy fats. Healthy fats called Omega-3 fatty acids are found in foods such as flaxseeds and coldwater fish, like sardines, salmon, and mackerel.  Limit how much you eat of the following: ? Canned or prepackaged foods. ? Food that is high in trans fat, such as fried foods. ? Food that is high in saturated fat, such as fatty meat. ? Sweets, desserts, sugary drinks, and other foods with added sugar. ? Full-fat dairy products.  Do not salt foods before  eating.  Try to eat at least 2 vegetarian meals each week.  Eat more home-cooked food and less restaurant, buffet, and fast food.  When eating at a restaurant, ask that your food be prepared with less salt or no salt, if possible. What foods are recommended? The items listed may not be a complete list. Talk with your dietitian about what dietary choices are best for you. Grains Whole-grain or whole-wheat bread. Whole-grain or whole-wheat pasta. Brown rice. [place]. Bulgur. Whole-grain and low-sodium cereals. Pita bread. Low-fat, low-sodium crackers. Whole-wheat flour tortillas. Vegetables Fresh or frozen vegetables (raw, steamed, roasted, or grilled). Low-sodium or reduced-sodium tomato and vegetable juice. Low-sodium or reduced-sodium tomato sauce and tomato paste. Low-sodium or reduced-sodium canned vegetables. Fruits All fresh, dried, or frozen fruit. Canned fruit in natural juice (without added sugar). Meat and other protein foods Skinless chicken or [place]. Ground chicken or [place]. Pork with fat trimmed off. Fish and seafood. Egg whites. Dried beans, peas, or lentils. Unsalted nuts, nut butters, and seeds. Unsalted canned beans. Lean cuts of beef with fat trimmed off. Low-sodium, lean deli meat. Dairy Low-fat (1%) or fat-free (skim) milk. Fat-free, low-fat, or reduced-fat cheeses. Nonfat, low-sodium ricotta or cottage cheese. Low-fat or nonfat yogurt. Low-fat, low-sodium cheese. Fats and oils Soft margarine without trans fats. Vegetable oil. Low-fat, reduced-fat, or light mayonnaise and salad dressings (reduced-sodium). Canola, safflower, olive, soybean, and sunflower oils. Avocado. Seasoning and other foods Herbs. Spices. Seasoning mixes without salt. Unsalted popcorn and pretzels. Fat-free sweets. What foods are not recommended? The items listed may not be a complete list. Talk with your dietitian about what dietary choices are best for you. Grains Baked goods made with fat,  such as croissants, muffins, or some breads. Dry pasta or rice meal packs. Vegetables Creamed or fried vegetables. Vegetables in a cheese sauce. Regular canned vegetables (not low-sodium or reduced-sodium). Regular canned tomato sauce and paste (not low-sodium or reduced-sodium). Regular tomato and vegetable juice (not low-sodium or reduced-sodium). Angie Fava. Olives. Fruits Canned fruit in a light or heavy syrup. Fried fruit. Fruit in cream or butter sauce. Meat and other protein foods Fatty cuts of meat. Ribs. Fried meat. Berniece Salines. Sausage. Bologna and other processed lunch meats. Salami. Fatback. Hotdogs. Bratwurst. Salted nuts and seeds. Canned beans with added salt. Canned or smoked fish. Whole eggs or egg yolks. Chicken or [place] with skin. Dairy Whole or 2% milk, cream, and half-and-half. Whole or full-fat cream cheese. Whole-fat or sweetened yogurt. Full-fat cheese. Nondairy creamers. Whipped toppings. Processed cheese and cheese spreads. Fats and oils Butter. Stick margarine. Lard. Shortening. Ghee. Bacon fat. Tropical oils, such as coconut, palm kernel, or palm oil. Seasoning and other foods Salted popcorn and pretzels. Onion salt, garlic salt, seasoned salt, table salt, and sea salt. Worcestershire sauce. Tartar sauce. Barbecue sauce. Teriyaki sauce. Soy sauce, including reduced-sodium. Steak sauce.  Canned and packaged gravies. Fish sauce. Oyster sauce. Cocktail sauce. Horseradish that you find on the shelf. Ketchup. Mustard. Meat flavorings and tenderizers. Bouillon cubes. Hot sauce and Tabasco sauce. Premade or packaged marinades. Premade or packaged taco seasonings. Relishes. Regular salad dressings. Where to find more information:  National Heart, Lung, and [place]: https://wilson-eaton.com/  American Heart Association: www.heart.org Summary  The DASH eating plan is a healthy eating plan that has been shown to reduce high blood pressure (hypertension). It may also reduce your risk for  type 2 diabetes, heart disease, and stroke.  With the DASH eating plan, you should limit salt (sodium) intake to 2,300 mg a day. If you have hypertension, you may need to reduce your sodium intake to 1,500 mg a day.  When on the DASH eating plan, aim to eat more fresh fruits and vegetables, whole grains, lean proteins, low-fat dairy, and heart-healthy fats.  Work with your health care provider or diet and nutrition specialist (dietitian) to adjust your eating plan to your individual calorie needs. This information is not intended to replace advice given to you by your health care provider. Make sure you discuss any questions you have with your health care provider. Document Released: 04/21/2011 Document Revised: 04/25/2016 Document Reviewed: 04/25/2016 Elsevier Interactive Patient Education  2017 [employer].

## 2017-02-06 ENCOUNTER — Ambulatory Visit: Payer: Medicare Other | Admitting: Podiatry

## 2017-05-11 ENCOUNTER — Ambulatory Visit (HOSPITAL_COMMUNITY)
Admission: EM | Admit: 2017-05-11 | Discharge: 2017-05-11 | Disposition: A | Discharge status: Home / Self Care | Payer: Medicare Other

## 2017-05-11 ENCOUNTER — Encounter (HOSPITAL_COMMUNITY): Payer: Self-pay | Admitting: Family Medicine

## 2017-05-11 DIAGNOSIS — R05 Cough: Secondary | ICD-10-CM | POA: Diagnosis not present

## 2017-05-11 DIAGNOSIS — R0982 Postnasal drip: Secondary | ICD-10-CM

## 2017-05-11 DIAGNOSIS — R059 Cough, unspecified: Secondary | ICD-10-CM

## 2017-05-11 NOTE — ED Triage Notes (Signed)
Pt here for URI symptoms since Christmas Eve

## 2017-05-11 NOTE — Discharge Instructions (Signed)
Take Allegra or Zyrtec daily for drainage in the back of the throat. Cepacol lozenges for sore throat discomfort and dryness. Drink plenty water during the day, before going to bed and upon awakening. Also may take Delsym for cough.

## 2017-05-11 NOTE — ED Provider Notes (Signed)
[place]    CSN: [id number] Arrival date & time: 05/11/17  1143     History   Chief Complaint Chief Complaint  Patient presents with  . Cough    HPI Isabel Shaw is a 46 y.o. female.   46 year old female complaining of a dry cough, itchy scratchy throat for 2-3 days. Take some medicine last night which helped for short time then stop working. Denies fever, chills, earaches. She does have a history of asthma and sometimes feels like she might be short of breath. Has used her inhaler.      Past Medical History:  Diagnosis Date  . Asthma   . Fibromyalgia   . Seizures [facility])     Patient Active Problem List   Diagnosis Date Noted  . Fibromyalgia 01/04/2017  . Allergic rhinitis 01/04/2017  . Asthma in adult, mild persistent, uncomplicated 21/19/4174  . Restless leg syndrome 01/04/2017  . Fibroid 09/26/2016  . Dysmenorrhea 09/26/2016  . Hypertension 09/26/2016  . Plantar fasciitis, bilateral 02/24/2015  . 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 Plantar fasciitis of left foot 10/18/2013  . Eschar of foot 09/17/2013  . Foot lesion 09/17/2013  . Bunion of great toe of right foot 05/28/2013  . TTS (tarsal tunnel syndrome) 10/31/2012  . Pain in joint, ankle and foot 08/08/2012    Past Surgical History:  Procedure Laterality Date  . BUNIONECTOMY Right 12/25/2015  . Heel spur resect w/Plantar Fasciotomy Right 09-22-03   Right foot  . NEURECTOMY FOOT Right 01/26/04   Right foot, Plantar calc.  Marland Kitchen Neuroplasty digital n. Right 07/02/07   Right x 3  . TARSAL TUNNEL RELEASE Right 01/26/04   Right foot  . TARSAL TUNNEL RELEASE Right 07/02/07   Right foot    OB History    Gravida Para Term Preterm AB Living   5 4   4 1 4    SAB TAB Ectopic Multiple Live Births   1               Home Medications    Prior to Admission medications   Medication Sig Start Date End Date Taking? Authorizing Provider  albuterol (PROVENTIL HFA;VENTOLIN HFA)  108 (90 Base) MCG/ACT inhaler Inhale 2 puffs into the lungs every 4 (four) hours as needed for wheezing or shortness of breath. 12/04/15   Lysbeth Penner, FNP  amitriptyline (ELAVIL) 50 MG tablet Take 50 mg by mouth at bedtime.    [provider]  fluticasone (FLONASE) 50 MCG/ACT nasal spray Place 1 spray into both nostrils daily.  07/22/13   [provider]  Fluticasone-Salmeterol (ADVAIR) 250-50 MCG/DOSE AEPB Inhale 1 puff into the lungs every 12 (twelve) hours.    [provider]  gabapentin (NEURONTIN) 300 MG capsule Take 600 mg by mouth 2 (two) times daily.    [provider]  hydrochlorothiazide (HYDRODIURIL) 25 MG tablet Take 25 mg by mouth daily.    [provider]  levETIRAcetam (KEPPRA) 500 MG tablet Take 500 mg by mouth 2 (two) times daily.    [provider]  loratadine (CLARITIN) 10 MG tablet Take 10 mg by mouth at bedtime.    [provider]  naproxen (NAPROXEN DR) 500 MG EC tablet Take 1 tablet (500 mg total) by mouth 2 (two) times daily with a meal. 01/04/17   [name], MD  traMADol (ULTRAM) 50 MG tablet Take 1 tablet (50 mg total) by mouth every 6 (six) hours  as needed. 10/21/15   Konrad Felix, PA    Family History History reviewed. No pertinent family history.  Social History Social History   Tobacco Use  . Smoking status: Never Smoker  . Smokeless tobacco: Never Used  Substance Use Topics  . Alcohol use: No    Alcohol/week: 0.0 oz  . Drug use: No     Allergies   Patient has no known allergies.   Review of Systems Review of Systems  Constitutional: Negative.  Negative for fever.  HENT:       As per history of present illness  Respiratory: Positive for cough.   Cardiovascular: Negative.   Gastrointestinal: Negative.   Neurological: Negative.   All other systems reviewed and are negative.    Physical Exam Triage Vital Signs ED Triage Vitals [05/11/17 1224]  Enc Vitals Group     BP  106/76     Pulse Rate 63     Resp 18     Temp 98.2 F (36.8 C)     Temp src      SpO2 97 %     Weight      Height      Head Circumference      Peak Flow      Pain Score      Pain Loc      Pain Edu?      Excl. in [place]?    No data found.  Updated Vital Signs BP 106/76   Pulse 63   Temp 98.2 F (36.8 C)   Resp 18   LMP 05/03/2017   SpO2 97%   Visual Acuity Right Eye Distance:   Left Eye Distance:   Bilateral Distance:    Right Eye Near:   Left Eye Near:    Bilateral Near:     Physical Exam  Constitutional: She is oriented to person, place, and time. She appears well-developed and well-nourished. No distress.  HENT:  Bilateral TMs are normal. Oropharynx with minor erythema and streaky light erythema in the posterior pharynx. Minor cobblestoning.  Neck: Normal range of motion. Neck supple.  Cardiovascular: Normal rate and regular rhythm.  Pulmonary/Chest: Effort normal and breath sounds normal. No respiratory distress.  Lungs are clear no wheezing or coarseness.  Musculoskeletal: Normal range of motion. She exhibits no edema.  Lymphadenopathy:    She has no cervical adenopathy.  Neurological: She is alert and oriented to person, place, and time.  Skin: Skin is warm and dry. No rash noted.  Psychiatric: She has a normal mood and affect.     UC Treatments / Results  Labs (all labs ordered are listed, but only abnormal results are displayed) Labs Reviewed - No data to display  EKG  EKG Interpretation None       Radiology No results found.  Procedures Procedures (including critical care time)  Medications Ordered in UC Medications - No data to display   Initial Impression / Assessment and Plan / UC Course  I have reviewed the triage vital signs and the nursing notes.  Pertinent labs & imaging results that were available during my care of the patient were reviewed by me and considered in my medical decision making (see chart for details).    Take  Allegra or Zyrtec daily for drainage in the back of the throat. Cepacol lozenges for sore throat discomfort and dryness. Drink plenty water during the day, before going to bed and upon awakening. Also may take Delsym for cough.  Final Clinical Impressions(s) / UC Diagnoses   Final diagnoses:  Cough  PND (post-nasal drip)    ED Discharge Orders    None       Controlled Substance Prescriptions [place] Controlled Substance Registry consulted? Not Applicable   Janne Napoleon, NP 05/11/17 1348

## 2017-11-03 ENCOUNTER — Ambulatory Visit (HOSPITAL_COMMUNITY)
Admission: EM | Admit: 2017-11-03 | Discharge: 2017-11-03 | Disposition: A | Discharge status: Home / Self Care | Payer: Medicare Other | Attending: Family Medicine | Admitting: Family Medicine

## 2017-11-03 ENCOUNTER — Encounter (HOSPITAL_COMMUNITY): Payer: Self-pay | Admitting: Emergency Medicine

## 2017-11-03 DIAGNOSIS — M546 Pain in thoracic spine: Secondary | ICD-10-CM

## 2017-11-03 MED ORDER — MELOXICAM 7.5 MG PO TABS: 7.5000 mg | ORAL_TABLET | Freq: Every day | ORAL | 0 refills | Status: DC

## 2017-11-03 MED ORDER — CYCLOBENZAPRINE HCL 5 MG PO TABS: 5.0000 mg | ORAL_TABLET | Freq: Every day | ORAL | 0 refills | Status: DC

## 2017-11-03 NOTE — Discharge Instructions (Signed)
Start Mobic. Do not take ibuprofen (motrin/advil)/ naproxen (aleve) while on mobic. You can add on tylenol if needing additional pain relief. Flexeril as needed at night. Flexeril can make you drowsy, so do not take if you are going to drive, operate heavy machinery, or make important decisions. Ice/heat compresses as needed. This can take up to 3-4 weeks to completely resolve, but you should be feeling better each week. Follow up here or with PCP if symptoms worsen, changes for reevaluation. If experiencing searing back pain, chest pain, shortness of breath, weakness, dizziness, go to the emergency department for further evaluation needed.

## 2017-11-03 NOTE — ED Provider Notes (Signed)
[facility]    CSN: [id number] Arrival date & time: 11/03/17  1031     History   Chief Complaint Chief Complaint  Patient presents with  . Back Pain    HPI Isabel Shaw is a 47 y.o. female.   47 year old female with history of seizures, fibromyalgia, asthma, hypertension comes in for 1 day history of mid thoracic back pain.  States she was bending over to pick something up when symptoms started.  No obvious injury/trauma.  Pain is 8 out of 10, cramping in sensation, constant, worse with movement and deep breathing.  States pain is also on the right side when lifting the arm.  Feels off as the muscles are being pulled.  She denies numbness, tingling.  Denies weakness, dizziness, syncope.  Denies chest pain, palpitations.  She took ibuprofen 800 mg with temporary resolution of pain.     Past Medical History:  Diagnosis Date  . Asthma   . Fibromyalgia   . Seizures [facility])     Patient Active Problem List   Diagnosis Date Noted  . Fibromyalgia 01/04/2017  . Allergic rhinitis 01/04/2017  . Asthma in adult, mild persistent, uncomplicated 11/13/1599  . Restless leg syndrome 01/04/2017  . Fibroid 09/26/2016  . Dysmenorrhea 09/26/2016  . Hypertension 09/26/2016  . Plantar fasciitis, bilateral 02/24/2015  . 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 Plantar fasciitis of left foot 10/18/2013  . Eschar of foot 09/17/2013  . Foot lesion 09/17/2013  . Bunion of great toe of right foot 05/28/2013  . TTS (tarsal tunnel syndrome) 10/31/2012  . Pain in joint, ankle and foot 08/08/2012    Past Surgical History:  Procedure Laterality Date  . BUNIONECTOMY Right 12/25/2015  . Heel spur resect w/Plantar Fasciotomy Right 09-22-03   Right foot  . NEURECTOMY FOOT Right 01/26/04   Right foot, Plantar calc.  Marland Kitchen Neuroplasty digital n. Right 07/02/07   Right x 3  . TARSAL TUNNEL RELEASE Right 01/26/04   Right foot  . TARSAL TUNNEL RELEASE Right 07/02/07   Right foot      OB History    Gravida  5   Para  4   Term      Preterm  4   AB  1   Living  4     SAB  1   TAB      Ectopic      Multiple      Live Births               Home Medications    Prior to Admission medications   Medication Sig Start Date End Date Taking? Authorizing Provider  albuterol (PROVENTIL HFA;VENTOLIN HFA) 108 (90 Base) MCG/ACT inhaler Inhale 2 puffs into the lungs every 4 (four) hours as needed for wheezing or shortness of breath. 12/04/15  Yes Lysbeth Penner, FNP  Fluticasone-Salmeterol (ADVAIR) 250-50 MCG/DOSE AEPB Inhale 1 puff into the lungs every 12 (twelve) hours.   Yes [provider]  gabapentin (NEURONTIN) 300 MG capsule Take 600 mg by mouth 2 (two) times daily.   Yes [provider]  hydrochlorothiazide (HYDRODIURIL) 25 MG tablet Take 25 mg by mouth daily.   Yes [provider]  levETIRAcetam (KEPPRA) 500 MG tablet Take 500 mg by mouth 2 (two) times daily.   Yes [provider]  loratadine (CLARITIN) 10 MG tablet Take 10 mg by mouth at bedtime.   Yes [provider]  amitriptyline (ELAVIL) 50 MG tablet  Take 50 mg by mouth at bedtime.    [provider]  cyclobenzaprine (FLEXERIL) 5 MG tablet Take 1 tablet (5 mg total) by mouth at bedtime. 11/03/17   Tasia Catchings, Nahmir Zeidman V, PA-C  fluticasone (FLONASE) 50 MCG/ACT nasal spray Place 1 spray into both nostrils daily.  07/22/13   [provider]  meloxicam (MOBIC) 7.5 MG tablet Take 1 tablet (7.5 mg total) by mouth daily. 11/03/17   Tasia Catchings, Daryle Boyington V, PA-C  traMADol (ULTRAM) 50 MG tablet Take 1 tablet (50 mg total) by mouth every 6 (six) hours as needed. 10/21/15   Konrad Felix, PA    Family History No family history on file.  Social History Social History   Tobacco Use  . Smoking status: Never Smoker  . Smokeless tobacco: Never Used  Substance Use Topics  . Alcohol use: No    Alcohol/week: 0.0 oz  . Drug use: No     Allergies   Patient has no known  allergies.   Review of Systems Review of Systems  Reason unable to perform ROS: See HPI as above.     Physical Exam Triage Vital Signs ED Triage Vitals [11/03/17 1044]  Enc Vitals Group     BP 116/82     Pulse Rate 80     Resp 18     Temp 98.2 F (36.8 C)     Temp Source Oral     SpO2 100 %     Weight      Height      Head Circumference      Peak Flow      Pain Score      Pain Loc      Pain Edu?      Excl. in [place]?    No data found.  Updated Vital Signs BP 116/82 (BP Location: Left Arm)   Pulse 80   Temp 98.2 F (36.8 C) (Oral)   Resp 18   SpO2 100%   Physical Exam  Constitutional: She is oriented to person, place, and time. She appears well-developed and well-nourished. No distress.  HENT:  Head: Normocephalic and atraumatic.  Eyes: Pupils are equal, round, and reactive to light. Conjunctivae are normal.  Cardiovascular: Normal rate, regular rhythm and normal heart sounds. Exam reveals no gallop and no friction rub.  No murmur heard. Pulmonary/Chest: Effort normal and breath sounds normal. She has no wheezes. She has no rales.  Musculoskeletal:  No tenderness on palpation of the thoracic right paraspinal muscle and parascapular area. Full range of motion of neck, shoulder, back. Strength normal and equal bilaterally. Normal grip strength. Sensation intact and equal bilaterally. Radial pulses 2+ and equal bilaterally. Capillary refill less than 2 seconds.   Neurological: She is alert and oriented to person, place, and time.  Skin: Skin is warm and dry.     UC Treatments / Results  Labs (all labs ordered are listed, but only abnormal results are displayed) Labs Reviewed - No data to display  EKG None  Radiology No results found.  Procedures Procedures (including critical care time)  Medications Ordered in UC Medications - No data to display  Initial Impression / Assessment and Plan / UC Course  I have reviewed the triage vital signs and the nursing  notes.  Pertinent labs & imaging results that were available during my care of the patient were reviewed by me and considered in my medical decision making (see chart for details).    Will treat for muscular pain  given temporary resolution of pain with NSAID, reproducible pain. Start NSAID as directed for pain and inflammation. Muscle relaxant as needed. Ice/heat compresses. Discussed with patient strain can take up to 3-4 weeks to resolve, but should be getting better each week. Return precautions given.   Final Clinical Impressions(s) / UC Diagnoses   Final diagnoses:  Acute right-sided thoracic back pain    ED Prescriptions    Medication Sig Dispense Auth. Provider   meloxicam (MOBIC) 7.5 MG tablet Take 1 tablet (7.5 mg total) by mouth daily. 15 tablet Anoushka Divito V, PA-C   cyclobenzaprine (FLEXERIL) 5 MG tablet Take 1 tablet (5 mg total) by mouth at bedtime. 10 tablet Tobin Chad, PA-C 11/03/17 1141

## 2017-11-03 NOTE — ED Triage Notes (Signed)
Pt c/o mid center back pain, worse with bending over and raising R arm, states it pulls something when she takes a deep breath.

## 2018-04-21 ENCOUNTER — Ambulatory Visit (HOSPITAL_COMMUNITY)
Admission: EM | Admit: 2018-04-21 | Discharge: 2018-04-21 | Disposition: A | Discharge status: Home / Self Care | Payer: Medicare Other | Attending: Physician Assistant | Admitting: Physician Assistant

## 2018-04-21 ENCOUNTER — Encounter (HOSPITAL_COMMUNITY): Payer: Self-pay | Admitting: Emergency Medicine

## 2018-04-21 DIAGNOSIS — R591 Generalized enlarged lymph nodes: Secondary | ICD-10-CM

## 2018-04-21 MED ORDER — ALBUTEROL SULFATE (2.5 MG/3ML) 0.083% IN NEBU: 2.5000 mg | INHALATION_SOLUTION | Freq: Four times a day (QID) | RESPIRATORY_TRACT | 0 refills | Status: AC | PRN

## 2018-04-21 MED ORDER — FLUTICASONE PROPIONATE 50 MCG/ACT NA SUSP: 2.0000 | Freq: Every day | NASAL | 0 refills | Status: DC

## 2018-04-21 MED ORDER — IPRATROPIUM BROMIDE 0.06 % NA SOLN: 2.0000 | Freq: Four times a day (QID) | NASAL | 0 refills | Status: AC

## 2018-04-21 NOTE — Discharge Instructions (Signed)
Start flonase and atrovent nasal spray for possible post nasal drainage causing some symptoms. Albuterol as needed for shortness of breath and wheezing. As discussed, swelling to the neck are from lymph nodes, most likely due to a viral illness. You may develop other symptoms similar to a cold/ Keep hydrated, your urine should be clear to pale yellow in color. Tylenol/motrin for fever and pain. Monitor for any worsening of symptoms, chest pain, shortness of breath, wheezing, swelling of the throat, follow up for reevaluation.

## 2018-04-21 NOTE — ED Provider Notes (Signed)
[place]    CSN: [id number] Arrival date & time: 04/21/18  1117     History   Chief Complaint Chief Complaint  Patient presents with  . Neck Pain    HPI Isabel Shaw is a 47 y.o. female.   47 year old female with history of asthma, fibromyalgia, seizures comes in for left neck swelling.  States for the past 2 to 3 days, has felt that her left neck is swelling.  States has also had increasing symptoms of asthma at nighttime that is controlled with albuterol.  No obvious rhinorrhea, nasal congestion, cough.  Denies fever, chills, night sweats.  Denies neck stiffness, nausea, vomiting.  Has not taken anything for symptoms.     Past Medical History:  Diagnosis Date  . Asthma   . Fibromyalgia   . Seizures [facility])     Patient Active Problem List   Diagnosis Date Noted  . Fibromyalgia 01/04/2017  . Allergic rhinitis 01/04/2017  . Asthma in adult, mild persistent, uncomplicated 74/04/8785  . Restless leg syndrome 01/04/2017  . Fibroid 09/26/2016  . Dysmenorrhea 09/26/2016  . Hypertension 09/26/2016  . Plantar fasciitis, bilateral 02/24/2015  . 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 Plantar fasciitis of left foot 10/18/2013  . Eschar of foot 09/17/2013  . Foot lesion 09/17/2013  . Bunion of great toe of right foot 05/28/2013  . TTS (tarsal tunnel syndrome) 10/31/2012  . Pain in joint, ankle and foot 08/08/2012    Past Surgical History:  Procedure Laterality Date  . BUNIONECTOMY Right 12/25/2015  . Heel spur resect w/Plantar Fasciotomy Right 09-22-03   Right foot  . NEURECTOMY FOOT Right 01/26/04   Right foot, Plantar calc.  Marland Kitchen Neuroplasty digital n. Right 07/02/07   Right x 3  . TARSAL TUNNEL RELEASE Right 01/26/04   Right foot  . TARSAL TUNNEL RELEASE Right 07/02/07   Right foot    OB History    Gravida  5   Para  4   Term      Preterm  4   AB  1   Living  4     SAB  1   TAB      Ectopic      Multiple      Live  Births               Home Medications    Prior to Admission medications   Medication Sig Start Date End Date Taking? Authorizing Provider  traMADol (ULTRAM) 50 MG tablet Take 1 tablet (50 mg total) by mouth every 6 (six) hours as needed. 10/21/15  Yes Konrad Felix, PA  albuterol (PROVENTIL HFA;VENTOLIN HFA) 108 (90 Base) MCG/ACT inhaler Inhale 2 puffs into the lungs every 4 (four) hours as needed for wheezing or shortness of breath. 12/04/15   Lysbeth Penner, FNP  albuterol (PROVENTIL) (2.5 MG/3ML) 0.083% nebulizer solution Take 3 mLs (2.5 mg total) by nebulization every 6 (six) hours as needed for wheezing or shortness of breath. 04/21/18   Tasia Catchings, Amy V, PA-C  amitriptyline (ELAVIL) 50 MG tablet Take 50 mg by mouth at bedtime.    [provider]  cyclobenzaprine (FLEXERIL) 5 MG tablet Take 1 tablet (5 mg total) by mouth at bedtime. 11/03/17   Tasia Catchings, Amy V, PA-C  fluticasone (FLONASE) 50 MCG/ACT nasal spray Place 2 sprays into both nostrils daily. 04/21/18   Tasia Catchings, Amy V, PA-C  Fluticasone-Salmeterol (ADVAIR) 250-50 MCG/DOSE AEPB Inhale 1 puff into the lungs  every 12 (twelve) hours.    [provider]  gabapentin (NEURONTIN) 300 MG capsule Take 600 mg by mouth 2 (two) times daily.    [provider]  hydrochlorothiazide (HYDRODIURIL) 25 MG tablet Take 25 mg by mouth daily.    [provider]  ipratropium (ATROVENT) 0.06 % nasal spray Place 2 sprays into both nostrils 4 (four) times daily. 04/21/18   Tasia Catchings, Amy V, PA-C  levETIRAcetam (KEPPRA) 500 MG tablet Take 500 mg by mouth 2 (two) times daily.    [provider]  loratadine (CLARITIN) 10 MG tablet Take 10 mg by mouth at bedtime.    [provider]  meloxicam (MOBIC) 7.5 MG tablet Take 1 tablet (7.5 mg total) by mouth daily. Patient not taking: Reported on 04/21/2018 11/03/17   Arturo Morton    Family History No family history on file.  Social History Social History   Tobacco Use  .  Smoking status: Never Smoker  . Smokeless tobacco: Never Used  Substance Use Topics  . Alcohol use: No    Alcohol/week: 0.0 standard drinks  . Drug use: No     Allergies   Patient has no known allergies.   Review of Systems Review of Systems  Reason unable to perform ROS: See HPI as above.     Physical Exam Triage Vital Signs ED Triage Vitals  Enc Vitals Group     BP 04/21/18 1229 126/83     Pulse Rate 04/21/18 1229 76     Resp 04/21/18 1229 16     Temp 04/21/18 1229 98.9 F (37.2 C)     Temp Source 04/21/18 1229 Oral     SpO2 04/21/18 1229 99 %     Weight --      Height --      Head Circumference --      Peak Flow --      Pain Score 04/21/18 1236 7     Pain Loc --      Pain Edu? --      Excl. in [place]? --    No data found.  Updated Vital Signs BP 126/83 (BP Location: Left Arm)   Pulse 76   Temp 98.9 F (37.2 C) (Oral)   Resp 16   SpO2 99%   Physical Exam  Constitutional: She is oriented to person, place, and time. She appears well-developed and well-nourished. No distress.  HENT:  Head: Normocephalic and atraumatic.  Right Ear: Tympanic membrane, external ear and ear canal normal. Tympanic membrane is not erythematous and not bulging.  Left Ear: Tympanic membrane, external ear and ear canal normal. Tympanic membrane is not erythematous and not bulging.  Nose: Nose normal. Right sinus exhibits no maxillary sinus tenderness and no frontal sinus tenderness. Left sinus exhibits no maxillary sinus tenderness and no frontal sinus tenderness.  Mouth/Throat: Uvula is midline, oropharynx is clear and moist and mucous membranes are normal.  Eyes: Pupils are equal, round, and reactive to light. Conjunctivae are normal.  Neck: Normal range of motion. Neck supple.  Cardiovascular: Normal rate, regular rhythm and normal heart sounds. Exam reveals no gallop and no friction rub.  No murmur heard. Pulmonary/Chest: Effort normal and breath sounds normal. She has no decreased  breath sounds. She has no wheezes. She has no rhonchi. She has no rales.  Lymphadenopathy:       Head (right side): Submandibular adenopathy present.  Neurological: She is alert and oriented to person, place, and time.  Skin: Skin  is warm and dry.  Psychiatric: She has a normal mood and affect. Her behavior is normal. Judgment normal.     UC Treatments / Results  Labs (all labs ordered are listed, but only abnormal results are displayed) Labs Reviewed - No data to display  EKG None  Radiology No results found.  Procedures Procedures (including critical care time)  Medications Ordered in UC Medications - No data to display  Initial Impression / Assessment and Plan / UC Course  I have reviewed the triage vital signs and the nursing notes.  Pertinent labs & imaging results that were available during my care of the patient were reviewed by me and considered in my medical decision making (see chart for details).    Right submandibular lymphadenopathy felt.  No obvious lymphadenopathy felt to the left.  No obvious swelling to the left neck.  Discussed possible viral illness causing symptoms.  Patient without wheezing on exam, will have patient continue to monitor symptoms.  Push fluids.  Return precautions given.  Patient expresses understanding and agrees to plan.  Final Clinical Impressions(s) / UC Diagnoses   Final diagnoses:  Lymphadenopathy    ED Prescriptions    Medication Sig Dispense Auth. Provider   fluticasone (FLONASE) 50 MCG/ACT nasal spray Place 2 sprays into both nostrils daily. 1 g Yu, Amy V, PA-C   ipratropium (ATROVENT) 0.06 % nasal spray Place 2 sprays into both nostrils 4 (four) times daily. 15 mL Yu, Amy V, PA-C   albuterol (PROVENTIL) (2.5 MG/3ML) 0.083% nebulizer solution Take 3 mLs (2.5 mg total) by nebulization every 6 (six) hours as needed for wheezing or shortness of breath. 75 mL Tobin Chad, [place] 04/21/18 1353

## 2018-04-21 NOTE — ED Triage Notes (Signed)
Pt states she has a knot on the L side of her neck that "gets irritated and it messes with my asthma"

## 2018-05-17 ENCOUNTER — Encounter (HOSPITAL_COMMUNITY): Payer: Self-pay | Admitting: Emergency Medicine

## 2018-05-17 ENCOUNTER — Ambulatory Visit (HOSPITAL_COMMUNITY)
Admission: EM | Admit: 2018-05-17 | Discharge: 2018-05-17 | Disposition: A | Discharge status: Home / Self Care | Payer: Medicare Other | Attending: Family Medicine | Admitting: Family Medicine

## 2018-05-17 ENCOUNTER — Other Ambulatory Visit: Payer: Self-pay

## 2018-05-17 DIAGNOSIS — N760 Acute vaginitis: Secondary | ICD-10-CM | POA: Insufficient documentation

## 2018-05-17 DIAGNOSIS — J4541 Moderate persistent asthma with (acute) exacerbation: Secondary | ICD-10-CM

## 2018-05-17 DIAGNOSIS — B9689 Other specified bacterial agents as the cause of diseases classified elsewhere: Secondary | ICD-10-CM | POA: Insufficient documentation

## 2018-05-17 LAB — POCT URINALYSIS DIP (DEVICE)
Bilirubin Urine: NEGATIVE
Glucose, UA: NEGATIVE mg/dL
Hgb urine dipstick: NEGATIVE
KETONES UR: NEGATIVE mg/dL
Leukocytes, UA: NEGATIVE
Nitrite: NEGATIVE
Protein, ur: NEGATIVE mg/dL
Specific Gravity, Urine: 1.02 (ref 1.005–1.030)
Urobilinogen, UA: 0.2 mg/dL (ref 0.0–1.0)
pH: 7.5 (ref 5.0–8.0)

## 2018-05-17 MED ORDER — METRONIDAZOLE 500 MG PO TABS: 500.0000 mg | ORAL_TABLET | Freq: Two times a day (BID) | ORAL | 0 refills | Status: DC

## 2018-05-17 MED ORDER — PREDNISONE 20 MG PO TABS: 20.0000 mg | ORAL_TABLET | Freq: Two times a day (BID) | ORAL | 0 refills | Status: DC

## 2018-05-17 NOTE — ED Triage Notes (Signed)
Pt reports a cough since Saturday and vaginal itching and an odor for the last few days.

## 2018-05-17 NOTE — ED Provider Notes (Signed)
[place]    CSN: [id number] Arrival date & time: 05/17/18  1045     History   Chief Complaint Chief Complaint  Patient presents with  . Cough  . Vaginal Itching    HPI Isabel Shaw is a 48 y.o. female.   HPI  Patient has had a cough for 4 days.  No fever chills.  No sputum production.  No body aches.  No known exposure to influenza.  Mild runny nose.  No sore throat.  No exposure to strep. In addition patient states for the last week she has had some vaginal discomfort, itching, and odor.  She has had BV in the past.  She has no concern for STD.  No abdominal pain.  No fever no urinary symptoms.  Past Medical History:  Diagnosis Date  . Asthma   . Fibromyalgia   . Seizures [facility])     Patient Active Problem List   Diagnosis Date Noted  . Fibromyalgia 01/04/2017  . Allergic rhinitis 01/04/2017  . Asthma in adult, mild persistent, uncomplicated 09/73/5329  . Restless leg syndrome 01/04/2017  . Fibroid 09/26/2016  . Dysmenorrhea 09/26/2016  . Hypertension 09/26/2016  . Plantar fasciitis, bilateral 02/24/2015  . 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 Plantar fasciitis of left foot 10/18/2013  . Eschar of foot 09/17/2013  . Foot lesion 09/17/2013  . Bunion of great toe of right foot 05/28/2013  . TTS (tarsal tunnel syndrome) 10/31/2012  . Pain in joint, ankle and foot 08/08/2012    Past Surgical History:  Procedure Laterality Date  . BUNIONECTOMY Right 12/25/2015  . Heel spur resect w/Plantar Fasciotomy Right 09-22-03   Right foot  . NEURECTOMY FOOT Right 01/26/04   Right foot, Plantar calc.  Marland Kitchen Neuroplasty digital n. Right 07/02/07   Right x 3  . TARSAL TUNNEL RELEASE Right 01/26/04   Right foot  . TARSAL TUNNEL RELEASE Right 07/02/07   Right foot    OB History    Gravida  5   Para  4   Term      Preterm  4   AB  1   Living  4     SAB  1   TAB      Ectopic      Multiple      Live Births                Home Medications    Prior to Admission medications   Medication Sig Start Date End Date Taking? Authorizing Provider  albuterol (PROVENTIL HFA;VENTOLIN HFA) 108 (90 Base) MCG/ACT inhaler Inhale 2 puffs into the lungs every 4 (four) hours as needed for wheezing or shortness of breath. 12/04/15  Yes Lysbeth Penner, FNP  albuterol (PROVENTIL) (2.5 MG/3ML) 0.083% nebulizer solution Take 3 mLs (2.5 mg total) by nebulization every 6 (six) hours as needed for wheezing or shortness of breath. 04/21/18  Yes Yu, Amy V, PA-C  Fluticasone-Salmeterol (ADVAIR) 250-50 MCG/DOSE AEPB Inhale 1 puff into the lungs every 12 (twelve) hours.   Yes [provider]  gabapentin (NEURONTIN) 300 MG capsule Take 600 mg by mouth 2 (two) times daily.   Yes [provider]  hydrochlorothiazide (HYDRODIURIL) 25 MG tablet Take 25 mg by mouth daily.   Yes [provider]  ibuprofen (ADVIL,MOTRIN) 800 MG tablet Take 800 mg by mouth every 8 (eight) hours as needed.   Yes [provider]  loratadine (CLARITIN) 10 MG tablet Take 10  mg by mouth at bedtime.   Yes [provider]  amitriptyline (ELAVIL) 50 MG tablet Take 50 mg by mouth at bedtime.    [provider]  cyclobenzaprine (FLEXERIL) 5 MG tablet Take 1 tablet (5 mg total) by mouth at bedtime. 11/03/17   Tasia Catchings, Amy V, PA-C  fluticasone (FLONASE) 50 MCG/ACT nasal spray Place 2 sprays into both nostrils daily. 04/21/18   Tasia Catchings, Amy V, PA-C  ipratropium (ATROVENT) 0.06 % nasal spray Place 2 sprays into both nostrils 4 (four) times daily. 04/21/18   Tasia Catchings, Amy V, PA-C  levETIRAcetam (KEPPRA) 500 MG tablet Take 500 mg by mouth 2 (two) times daily.    [provider]  metroNIDAZOLE (FLAGYL) 500 MG tablet Take 1 tablet (500 mg total) by mouth 2 (two) times daily. 05/17/18   Raylene Everts, MD  predniSONE (DELTASONE) 20 MG tablet Take 1 tablet (20 mg total) by mouth 2 (two) times daily with a meal. 05/17/18   Raylene Everts,  MD    Family History History reviewed. No pertinent family history.  Social History Social History   Tobacco Use  . Smoking status: Never Smoker  . Smokeless tobacco: Never Used  Substance Use Topics  . Alcohol use: No    Alcohol/week: 0.0 standard drinks  . Drug use: No     Allergies   Patient has no known allergies.   Review of Systems Review of Systems  Constitutional: Negative for chills and fever.  HENT: Negative for ear pain and sore throat.   Eyes: Negative for pain and visual disturbance.  Respiratory: Positive for cough. Negative for shortness of breath.   Cardiovascular: Negative for chest pain and palpitations.  Gastrointestinal: Negative for abdominal pain and vomiting.  Genitourinary: Positive for vaginal discharge. Negative for dysuria and hematuria.  Musculoskeletal: Negative for arthralgias and back pain.  Skin: Negative for color change and rash.  Neurological: Negative for seizures and syncope.  All other systems reviewed and are negative.    Physical Exam Triage Vital Signs ED Triage Vitals [05/17/18 1218]  Enc Vitals Group     BP 118/86     Pulse Rate 71     Resp      Temp 98.7 F (37.1 C)     Temp Source Oral     SpO2 100 %     Weight      Height      Head Circumference      Peak Flow      Pain Score 0     Pain Loc      Pain Edu?      Excl. in [place]?    No data found.  Updated Vital Signs BP 118/86 (BP Location: Right Arm)   Pulse 71   Temp 98.7 F (37.1 C) (Oral)   LMP 04/23/2018 (Approximate)   SpO2 100%   Visual Acuity Right Eye Distance:   Left Eye Distance:   Bilateral Distance:    Right Eye Near:   Left Eye Near:    Bilateral Near:     Physical Exam Constitutional:      General: She is not in acute distress.    Appearance: She is well-developed.  HENT:     Head: Normocephalic and atraumatic.     Right Ear: Tympanic membrane and ear canal normal.     Left Ear: Tympanic membrane and ear canal normal.     Nose:  Nose normal. No congestion.     Mouth/Throat:  Mouth: Mucous membranes are moist.  Eyes:     Conjunctiva/sclera: Conjunctivae normal.     Pupils: Pupils are equal, round, and reactive to light.  Neck:     Musculoskeletal: Normal range of motion.  Cardiovascular:     Rate and Rhythm: Normal rate and regular rhythm.     Heart sounds: Normal heart sounds.  Pulmonary:     Effort: Pulmonary effort is normal. No respiratory distress.     Breath sounds: Normal breath sounds.     Comments: Lungs have scattered wheezing.  No rales or rhonchi Abdominal:     General: There is no distension.     Palpations: Abdomen is soft.  Musculoskeletal: Normal range of motion.  Skin:    General: Skin is warm and dry.  Neurological:     Mental Status: She is alert.      UC Treatments / Results  Labs (all labs ordered are listed, but only abnormal results are displayed) Labs Reviewed  POCT URINALYSIS DIP (DEVICE)    EKG None  Radiology No results found.  Procedures Procedures (including critical care time)  Medications Ordered in UC Medications - No data to display  Initial Impression / Assessment and Plan / UC Course  I have reviewed the triage vital signs and the nursing notes.  Pertinent labs & imaging results that were available during my care of the patient were reviewed by me and considered in my medical decision making (see chart for details).      Final Clinical Impressions(s) / UC Diagnoses   Final diagnoses:  Moderate persistent asthma with acute exacerbation  BV (bacterial vaginosis)     Discharge Instructions     Drink plenty of water Take the prednisone 2 x a day for 5 days Take 2 doses today  Take the metronidazole for the BV Consider getting a probiotic for prevention of BV ( like activia yogurt or probiotic pill)    ED Prescriptions    Medication Sig Dispense Auth. Provider   metroNIDAZOLE (FLAGYL) 500 MG tablet Take 1 tablet (500 mg total) by mouth  2 (two) times daily. 14 tablet Raylene Everts, MD   predniSONE (DELTASONE) 20 MG tablet Take 1 tablet (20 mg total) by mouth 2 (two) times daily with a meal. 10 tablet Raylene Everts, MD     Controlled Substance Prescriptions [place] Controlled Substance Registry consulted? Not Applicable   Raylene Everts, MD 05/17/18 [phone]

## 2018-05-17 NOTE — Discharge Instructions (Signed)
Drink plenty of water Take the prednisone 2 x a day for 5 days Take 2 doses today  Take the metronidazole for the BV Consider getting a probiotic for prevention of BV ( like activia yogurt or probiotic pill)

## 2018-11-11 ENCOUNTER — Encounter (HOSPITAL_COMMUNITY): Payer: Self-pay | Admitting: Emergency Medicine

## 2018-11-11 ENCOUNTER — Ambulatory Visit (HOSPITAL_COMMUNITY)
Admission: EM | Admit: 2018-11-11 | Discharge: 2018-11-11 | Disposition: A | Discharge status: Home / Self Care | Payer: Medicare Other | Attending: Physician Assistant | Admitting: Physician Assistant

## 2018-11-11 ENCOUNTER — Other Ambulatory Visit: Payer: Self-pay

## 2018-11-11 DIAGNOSIS — M778 Other enthesopathies, not elsewhere classified: Secondary | ICD-10-CM | POA: Diagnosis not present

## 2018-11-11 MED ORDER — MELOXICAM 7.5 MG PO TABS: 7.5000 mg | ORAL_TABLET | Freq: Every day | ORAL | 0 refills | Status: DC

## 2018-11-11 NOTE — Discharge Instructions (Signed)
Start Mobic. Do not take ibuprofen (motrin/advil)/ naproxen (aleve) while on mobic. Ice compress, rest. Avoid heavy lifting for the next 5 days. Follow up with PCP if symptoms not improving.

## 2018-11-11 NOTE — ED Triage Notes (Signed)
Per pt she has been having a knot on her right forearm that has been there now for 2 weeks. Pt says it has gotten bigger and began to hurt. No injury no fall

## 2018-11-11 NOTE — ED Provider Notes (Signed)
[place]    CSN: [id number] Arrival date & time: 11/11/18  1041     History   Chief Complaint Chief Complaint  Patient presents with  . Arm Pain    HPI CAREEN MAUCH is a 48 y.o. female.   48 year old female comes in for right arm/elbow pain for the past 2 weeks. States there is swelling to the right lateral elbow that is increasing in size. Denies injury/trauma. Now with pain radiating to mid forearm and mid upper arm. Denies numbness/tingling, loss of grip strength. Full ROM. Has not taken anything for the symptoms. Work requires heavy lifting.      Past Medical History:  Diagnosis Date  . Asthma   . Fibromyalgia   . Seizures [facility])     Patient Active Problem List   Diagnosis Date Noted  . Fibromyalgia 01/04/2017  . Allergic rhinitis 01/04/2017  . Asthma in adult, mild persistent, uncomplicated 35/32/9924  . Restless leg syndrome 01/04/2017  . Fibroid 09/26/2016  . Dysmenorrhea 09/26/2016  . Hypertension 09/26/2016  . Plantar fasciitis, bilateral 02/24/2015  . 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 Plantar fasciitis of left foot 10/18/2013  . Eschar of foot 09/17/2013  . Foot lesion 09/17/2013  . Bunion of great toe of right foot 05/28/2013  . TTS (tarsal tunnel syndrome) 10/31/2012  . Pain in joint, ankle and foot 08/08/2012    Past Surgical History:  Procedure Laterality Date  . BUNIONECTOMY Right 12/25/2015  . Heel spur resect w/Plantar Fasciotomy Right 09-22-03   Right foot  . NEURECTOMY FOOT Right 01/26/04   Right foot, Plantar calc.  Marland Kitchen Neuroplasty digital n. Right 07/02/07   Right x 3  . TARSAL TUNNEL RELEASE Right 01/26/04   Right foot  . TARSAL TUNNEL RELEASE Right 07/02/07   Right foot    OB History    Gravida  5   Para  4   Term      Preterm  4   AB  1   Living  4     SAB  1   TAB      Ectopic      Multiple      Live Births               Home Medications    Prior to Admission  medications   Medication Sig Start Date End Date Taking? Authorizing Provider  albuterol (PROVENTIL HFA;VENTOLIN HFA) 108 (90 Base) MCG/ACT inhaler Inhale 2 puffs into the lungs every 4 (four) hours as needed for wheezing or shortness of breath. 12/04/15   Lysbeth Penner, FNP  albuterol (PROVENTIL) (2.5 MG/3ML) 0.083% nebulizer solution Take 3 mLs (2.5 mg total) by nebulization every 6 (six) hours as needed for wheezing or shortness of breath. 04/21/18   Tasia Catchings, Blakelynn Scheeler V, PA-C  amitriptyline (ELAVIL) 50 MG tablet Take 50 mg by mouth at bedtime.    [provider]  cyclobenzaprine (FLEXERIL) 5 MG tablet Take 1 tablet (5 mg total) by mouth at bedtime. 11/03/17   Tasia Catchings, Xianna Siverling V, PA-C  fluticasone (FLONASE) 50 MCG/ACT nasal spray Place 2 sprays into both nostrils daily. 04/21/18   Tasia Catchings, Imara Standiford V, PA-C  Fluticasone-Salmeterol (ADVAIR) 250-50 MCG/DOSE AEPB Inhale 1 puff into the lungs every 12 (twelve) hours.    [provider]  gabapentin (NEURONTIN) 300 MG capsule Take 600 mg by mouth 2 (two) times daily.    [provider]  hydrochlorothiazide (HYDRODIURIL) 25 MG tablet Take  25 mg by mouth daily.    [provider]  ibuprofen (ADVIL,MOTRIN) 800 MG tablet Take 800 mg by mouth every 8 (eight) hours as needed.    [provider]  ipratropium (ATROVENT) 0.06 % nasal spray Place 2 sprays into both nostrils 4 (four) times daily. 04/21/18   Tasia Catchings, Lexy Meininger V, PA-C  levETIRAcetam (KEPPRA) 500 MG tablet Take 500 mg by mouth 2 (two) times daily.    [provider]  loratadine (CLARITIN) 10 MG tablet Take 10 mg by mouth at bedtime.    [provider]  meloxicam (MOBIC) 7.5 MG tablet Take 1 tablet (7.5 mg total) by mouth daily. 11/11/18   Tasia Catchings, Ayrabella Labombard V, PA-C  metroNIDAZOLE (FLAGYL) 500 MG tablet Take 1 tablet (500 mg total) by mouth 2 (two) times daily. 05/17/18   Raylene Everts, MD  predniSONE (DELTASONE) 20 MG tablet Take 1 tablet (20 mg total) by mouth 2 (two) times daily with a  meal. 05/17/18   Raylene Everts, MD    Family History History reviewed. No pertinent family history.  Social History Social History   Tobacco Use  . Smoking status: Never Smoker  . Smokeless tobacco: Never Used  Substance Use Topics  . Alcohol use: No    Alcohol/week: 0.0 standard drinks  . Drug use: No     Allergies   Patient has no known allergies.   Review of Systems Review of Systems  Reason unable to perform ROS: See HPI as above.     Physical Exam Triage Vital Signs ED Triage Vitals [11/11/18 1140]  Enc Vitals Group     BP 125/85     Pulse Rate 74     Resp 16     Temp 98.4 F (36.9 C)     Temp Source Oral     SpO2 98 %     Weight      Height      Head Circumference      Peak Flow      Pain Score 4     Pain Loc      Pain Edu?      Excl. in [place]?    No data found.  Updated Vital Signs BP 125/85 (BP Location: Right Arm)   Pulse 74   Temp 98.4 F (36.9 C) (Oral)   Resp 16   SpO2 98%   Physical Exam Constitutional:      General: She is not in acute distress.    Appearance: She is well-developed. She is not diaphoretic.  HENT:     Head: Normocephalic and atraumatic.  Eyes:     Conjunctiva/sclera: Conjunctivae normal.     Pupils: Pupils are equal, round, and reactive to light.  Musculoskeletal:     Comments: Mild swelling to the right lateral elbow without erythema, warmth, contusion. No cyst palpated. Area with generalized tenderness to palpation. No increase in tenderness to the lateral epicondyle. Mild tenderness to palpation diffusely to the mid forearm and mid upper arm. Full ROM of shoulder, elbow, wrist. Decreased strength of elbow due to pain. Sensation intact and equal bilaterally. Radial pulse 2+, cap refill <2s  Neurological:     Mental Status: She is alert and oriented to person, place, and time.    UC Treatments / Results  Labs (all labs ordered are listed, but only abnormal results are displayed) Labs Reviewed - No data to  display  EKG None  Radiology No results found.  Procedures Procedures (including critical care time)  Medications Ordered in UC Medications - No data to display  Initial Impression / Assessment and Plan / UC Course  I have reviewed the triage vital signs and the nursing notes.  Pertinent labs & imaging results that were available during my care of the patient were reviewed by me and considered in my medical decision making (see chart for details).    Low suspicion for cyst, abscess, cellulitis. Will treat for tendinitis with mobic. Ice compress, rest. Return precautions given. Patient expresses understanding and agrees to plan.  Final Clinical Impressions(s) / UC Diagnoses   Final diagnoses:  Right elbow tendinitis   ED Prescriptions    Medication Sig Dispense Auth. Provider   meloxicam (MOBIC) 7.5 MG tablet Take 1 tablet (7.5 mg total) by mouth daily. 15 tablet Tobin Chad, [place] 11/11/18 1229

## 2019-03-26 ENCOUNTER — Other Ambulatory Visit: Payer: Self-pay

## 2019-03-26 ENCOUNTER — Ambulatory Visit (HOSPITAL_COMMUNITY)
Admission: EM | Admit: 2019-03-26 | Discharge: 2019-03-26 | Disposition: A | Discharge status: Home / Self Care | Payer: Medicare Other

## 2019-03-26 ENCOUNTER — Encounter (HOSPITAL_COMMUNITY): Payer: Self-pay | Admitting: Emergency Medicine

## 2019-03-26 DIAGNOSIS — M7711 Lateral epicondylitis, right elbow: Secondary | ICD-10-CM

## 2019-03-26 NOTE — ED Triage Notes (Signed)
Right arm pain since June.  Pain in right elbow, pain radiates into right neck.  Patient reports difficulty in maintaining strength in right arm to lift items.

## 2019-03-26 NOTE — ED Provider Notes (Signed)
[place]    CSN: [id number] Arrival date & time: 03/26/19  1354      History   Chief Complaint Chief Complaint  Patient presents with  . Arm Pain    HPI Isabel Shaw is a 48 y.o. female. who presents with recurrent R lateral elbow pain but is now radiating to her R shoulder for the past week. She admits she does a lot of repetitive work with this hand at work. She has never tried any braces for this. Has been taking Ibuprofen 80 mg  Only when home since it sedates her. Denies paresthesia. Her pain is located on lateral elbow, pain is describes as feeling pins when palpated. If not moving it, pain is described as tingling and throbbing. Pain level with activity or palpation 9/10, and when not moving it 9/10. Has not seen ortho.  Her PCP gave her a steroid shot last month which helped for 3 weeks and the pain came back.     Past Medical History:  Diagnosis Date  . Asthma   . Fibromyalgia   . Seizures [facility])     Patient Active Problem List   Diagnosis Date Noted  . Fibromyalgia 01/04/2017  . Allergic rhinitis 01/04/2017  . Asthma in adult, mild persistent, uncomplicated [id number]  . Restless leg syndrome 01/04/2017  . Fibroid 09/26/2016  . Dysmenorrhea 09/26/2016  . Hypertension 09/26/2016  . Plantar fasciitis, bilateral 02/24/2015  . 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 Plantar fasciitis of left foot 10/18/2013  . Eschar of foot 09/17/2013  . Foot lesion 09/17/2013  . Bunion of great toe of right foot 05/28/2013  . TTS (tarsal tunnel syndrome) 10/31/2012  . Pain in joint, ankle and foot 08/08/2012    Past Surgical History:  Procedure Laterality Date  . BUNIONECTOMY Right 12/25/2015  . Heel spur resect w/Plantar Fasciotomy Right 09-22-03   Right foot  . NEURECTOMY FOOT Right 01/26/04   Right foot, Plantar calc.  Marland Kitchen Neuroplasty digital n. Right 07/02/07   Right x 3  . TARSAL TUNNEL RELEASE Right 01/26/04   Right foot  . TARSAL  TUNNEL RELEASE Right 07/02/07   Right foot    OB History    Gravida  5   Para  4   Term      Preterm  4   AB  1   Living  4     SAB  1   TAB      Ectopic      Multiple      Live Births               Home Medications    Prior to Admission medications   Medication Sig Start Date End Date Taking? Authorizing Provider  albuterol (PROVENTIL HFA;VENTOLIN HFA) 108 (90 Base) MCG/ACT inhaler Inhale 2 puffs into the lungs every 4 (four) hours as needed for wheezing or shortness of breath. 12/04/15  Yes Lysbeth Penner, FNP  albuterol (PROVENTIL) (2.5 MG/3ML) 0.083% nebulizer solution Take 3 mLs (2.5 mg total) by nebulization every 6 (six) hours as needed for wheezing or shortness of breath. 04/21/18  Yes Yu, Amy V, PA-C  amitriptyline (ELAVIL) 50 MG tablet Take 50 mg by mouth at bedtime.   Yes [provider]  cyclobenzaprine (FLEXERIL) 5 MG tablet Take 1 tablet (5 mg total) by mouth at bedtime. 11/03/17  Yes Yu, Amy V, PA-C  Fluticasone-Salmeterol (ADVAIR) 250-50 MCG/DOSE AEPB Inhale 1 puff into the lungs every  12 (twelve) hours.   Yes [provider]  gabapentin (NEURONTIN) 300 MG capsule Take 600 mg by mouth 2 (two) times daily.   Yes [provider]  hydrochlorothiazide (HYDRODIURIL) 25 MG tablet Take 25 mg by mouth daily.   Yes [provider]  ipratropium (ATROVENT) 0.06 % nasal spray Place 2 sprays into both nostrils 4 (four) times daily. 04/21/18  Yes Yu, Amy V, PA-C  levETIRAcetam (KEPPRA) 500 MG tablet Take 500 mg by mouth 2 (two) times daily.   Yes [provider]  loratadine (CLARITIN) 10 MG tablet Take 10 mg by mouth at bedtime.   Yes [provider]  meloxicam (MOBIC) 7.5 MG tablet Take 1 tablet (7.5 mg total) by mouth daily. 11/11/18  Yes Yu, Amy V, PA-C  fluticasone (FLONASE) 50 MCG/ACT nasal spray Place 2 sprays into both nostrils daily. 04/21/18   Tasia Catchings, Amy V, PA-C  ibuprofen (ADVIL,MOTRIN) 800 MG tablet Take 800  mg by mouth every 8 (eight) hours as needed.    [provider]  metroNIDAZOLE (FLAGYL) 500 MG tablet Take 1 tablet (500 mg total) by mouth 2 (two) times daily. 05/17/18   Raylene Everts, MD  predniSONE (DELTASONE) 20 MG tablet Take 1 tablet (20 mg total) by mouth 2 (two) times daily with a meal. 05/17/18   Raylene Everts, MD    Family History History reviewed. No pertinent family history.  Social History Social History   Tobacco Use  . Smoking status: Never Smoker  . Smokeless tobacco: Never Used  Substance Use Topics  . Alcohol use: No    Alcohol/week: 0.0 standard drinks  . Drug use: No     Allergies   Patient has no known allergies.   Review of Systems Review of Systems  Musculoskeletal: Negative for gait problem, joint swelling, neck pain and neck stiffness.  Skin: Negative for rash and wound.  Neurological: Positive for weakness. Negative for tremors and numbness.     Physical Exam Triage Vital Signs ED Triage Vitals  Enc Vitals Group     BP 03/26/19 1451 108/82     Pulse Rate 03/26/19 1451 72     Resp 03/26/19 1451 18     Temp 03/26/19 1451 98.3 F (36.8 C)     Temp Source 03/26/19 1451 Oral     SpO2 03/26/19 1451 100 %     Weight --      Height --      Head Circumference --      Peak Flow --      Pain Score 03/26/19 1445 9     Pain Loc --      Pain Edu? --      Excl. in [place]? --    No data found.  Updated Vital Signs BP 108/82 (BP Location: Left Arm)   Pulse 72   Temp 98.3 F (36.8 C) (Oral)   Resp 18   LMP 03/10/2019   SpO2 100%   Visual Acuity Right Eye Distance:   Left Eye Distance:   Bilateral Distance:    Right Eye Near:   Left Eye Near:    Bilateral Near:     Physical Exam Vitals signs and nursing note reviewed.  HENT:     Head: Atraumatic.     Right Ear: External ear normal.     Left Ear: External ear normal.  Eyes:     General: No scleral icterus.    Extraocular Movements: Extraocular movements intact.  Conjunctiva/sclera: Conjunctivae normal.  Neck:     Musculoskeletal: Neck supple. No neck rigidity or muscular tenderness.  Pulmonary:     Effort: Pulmonary effort is normal.  Musculoskeletal:        General: Swelling and tenderness present. No deformity or signs of injury.     Comments: R ARM- has mild swelling on R proximal radial forearm which is very tender to palpation as well as the lateral epicondyle region.   Lymphadenopathy:     Cervical: No cervical adenopathy.  Skin:    General: Skin is warm and dry.     Capillary Refill: Capillary refill takes less than 2 seconds.     Findings: No bruising or rash.  Neurological:     Mental Status: She is alert and oriented to person, place, and time.     Sensory: No sensory deficit.     Gait: Gait normal.     Deep Tendon Reflexes: Reflexes normal.     Comments: R finger grip +4/5. L finger grip +5/5  Psychiatric:        Mood and Affect: Mood normal.        Behavior: Behavior normal.        Thought Content: Thought content normal.        Judgment: Judgment normal.    UC Treatments / Results  Labs (all labs ordered are listed, but only abnormal results are displayed) Labs Reviewed - No data to display  EKG   Radiology No results found.  Procedures Procedures (including critical care time)  Medications Ordered in UC Medications - No data to display  Initial Impression / Assessment and Plan / UC Course  I have reviewed the triage vital signs and the nursing notes. I personally placed a tennis elbow brace and taught her how to wear it. Needs to FU with ortho next week.   Final Clinical Impressions(s) / UC Diagnoses   Final diagnoses:  None   Discharge Instructions   None    ED Prescriptions    None     PDMP not reviewed this encounter.   Shelby Mattocks, Hershal [place]ia 03/26/19 2158

## 2019-03-26 NOTE — Discharge Instructions (Addendum)
Keep the brace on specially when you are using your right arm for 7-10 days. Follow up with orthopedics next week if your family Dr Does not need to refer you first.

## 2019-10-17 ENCOUNTER — Ambulatory Visit (INDEPENDENT_AMBULATORY_CARE_PROVIDER_SITE_OTHER): Payer: Medicare Other | Admitting: Family Medicine

## 2019-10-17 ENCOUNTER — Encounter: Payer: Self-pay | Admitting: Family Medicine

## 2019-10-17 ENCOUNTER — Other Ambulatory Visit: Payer: Self-pay

## 2019-10-17 VITALS — BP 113/77 | HR 67 | Wt 156.7 lb

## 2019-10-17 DIAGNOSIS — D219 Benign neoplasm of connective and other soft tissue, unspecified: Secondary | ICD-10-CM

## 2019-10-17 DIAGNOSIS — N946 Dysmenorrhea, unspecified: Secondary | ICD-10-CM

## 2019-10-17 NOTE — Progress Notes (Signed)
   Subjective:    Patient ID: Isabel Shaw is a 49 y.o. female presenting with Fibroids  on 10/17/2019  HPI: Here to f/u fibroids. Has h/o 2 cm fibroid. Reports normal cycles no pelvic pain. She is not having hny GYN issues. PCP did pap smear most recently.  Review of Systems  Constitutional: Negative for chills and fever.  Respiratory: Negative for shortness of breath.   Cardiovascular: Negative for chest pain.  Gastrointestinal: Negative for abdominal pain, nausea and vomiting.  Genitourinary: Negative for dysuria.  Skin: Negative for rash.      Objective:    BP 113/77   Pulse 67   Wt 156 lb 11.2 oz (71.1 kg)   BMI 31.65 kg/m  Physical Exam Constitutional:      General: She is not in acute distress.    Appearance: She is well-developed.  HENT:     Head: Normocephalic and atraumatic.  Eyes:     General: No scleral icterus. Cardiovascular:     Rate and Rhythm: Normal rate.  Pulmonary:     Effort: Pulmonary effort is normal.  Abdominal:     Palpations: Abdomen is soft. There is no mass.  Musculoskeletal:     Cervical back: Neck supple.  Skin:    General: Skin is warm and dry.  Neurological:     Mental Status: She is alert and oriented to person, place, and time.         Assessment & Plan:   Problem List Items Addressed This Visit      Unprioritized   Fibroid - Primary    No enlargement of uterus      Dysmenorrhea    Uses Ibuprofen as needed         Total time: 12 minutes.  No follow-ups on file.  [name] 10/18/2019 12:19 PM

## 2019-10-17 NOTE — Patient Instructions (Signed)
Uterine Fibroids  Uterine fibroids are lumps of tissue (tumors) in your womb (uterus). They are not cancer (are benign). Most women with this condition do not need treatment. Sometimes fibroids can affect your ability to have children (your fertility). If that happens, you may need surgery to take out the fibroids. Follow these instructions at home:  Take over-the-counter and prescription medicines only as told by your doctor. Your doctor may suggest NSAIDs (such as aspirin or ibuprofen) to help with pain.  Ask your doctor if you should: ? Take iron pills. ? Eat more foods that have iron in them, such as dark green, leafy vegetables.  If directed, apply heat to your back or belly to reduce pain. Use the heat source that your doctor recommends, such as a moist heat pack or a heating pad. ? Put a towel between your skin and the heat source. ? Leave the heat on for 20-30 minutes. ? Remove the heat if your skin turns bright red. This is especially important if you are unable to feel pain, heat, or cold. You may have a greater risk of getting burned.  Pay close attention to your period (menstrual) cycles. Tell your doctor about any changes, such as: ? A heavier blood flow than usual. ? Needing to use more pads or tampons than normal. ? A change in how many days your period lasts. ? A change in symptoms that come with your period, such as cramps or back pain.  Keep all follow-up visits as told by your doctor. This is important. Your doctor may need to watch your fibroids over time for any changes. Contact a doctor if you:  Have pain that does not get better with medicine or heat, such as pain or cramps in: ? Your back. ? The area between your hip bones (pelvic area). ? Your belly.  Have new bleeding between your periods.  Have more bleeding during or between your periods.  Feel very tired or weak.  Feel light-headed. Get help right away if you:  Pass out (faint).  Have pain in the  area between your hip bones that suddenly gets worse.  Have bleeding that soaks a tampon or pad in 30 minutes or less. Summary  Uterine fibroids are lumps of tissue (tumors) in your womb (uterus). They are not cancer.  The only treatment that most women need is taking aspirin or ibuprofen for pain.  Contact a doctor if you have pain or cramps that do not get better with medicine.  Make sure you know what symptoms you should get help for right away. This information is not intended to replace advice given to you by your health care provider. Make sure you discuss any questions you have with your health care provider. Document Revised: 04/14/2017 Document Reviewed: 03/28/2017 Elsevier Patient Education  2020 [employer]  

## 2019-10-18 ENCOUNTER — Encounter: Payer: Self-pay | Admitting: Family Medicine

## 2019-10-18 NOTE — Assessment & Plan Note (Signed)
Uses Ibuprofen as needed

## 2019-10-18 NOTE — Assessment & Plan Note (Signed)
No enlargement of uterus

## 2019-11-04 ENCOUNTER — Telehealth: Payer: Self-pay | Admitting: General Practice

## 2019-11-04 NOTE — Telephone Encounter (Signed)
Patient called and left message on nurse voicemail line stating she is on her period but it is slowly going on. She reports having intense ovulation pain though so much so she cannot sleep. Patient is requesting a call back.  Called patient, no answer- left message stating we are trying to reach you to return your phone call, please call us back if you still need assistance.

## 2019-11-05 ENCOUNTER — Encounter (HOSPITAL_COMMUNITY): Payer: Self-pay

## 2019-11-05 ENCOUNTER — Ambulatory Visit (HOSPITAL_COMMUNITY)
Admission: EM | Admit: 2019-11-05 | Discharge: 2019-11-05 | Disposition: A | Discharge status: Home / Self Care | Payer: Medicare Other | Attending: Family Medicine | Admitting: Family Medicine

## 2019-11-05 ENCOUNTER — Other Ambulatory Visit: Payer: Self-pay

## 2019-11-05 DIAGNOSIS — N390 Urinary tract infection, site not specified: Secondary | ICD-10-CM | POA: Diagnosis not present

## 2019-11-05 DIAGNOSIS — R109 Unspecified abdominal pain: Secondary | ICD-10-CM | POA: Diagnosis not present

## 2019-11-05 DIAGNOSIS — Z3202 Encounter for pregnancy test, result negative: Secondary | ICD-10-CM

## 2019-11-05 HISTORY — DX: Leiomyoma of uterus, unspecified: D25.9

## 2019-11-05 LAB — POC URINE PREG, ED: Preg Test, Ur: NEGATIVE

## 2019-11-05 LAB — POCT URINALYSIS DIP (DEVICE)
Bilirubin Urine: NEGATIVE
Glucose, UA: NEGATIVE mg/dL
Hgb urine dipstick: NEGATIVE
Ketones, ur: NEGATIVE mg/dL
Nitrite: POSITIVE — AB
Protein, ur: NEGATIVE mg/dL
Specific Gravity, Urine: >=1.03 (ref 1.005–1.030)
Urobilinogen, UA: 0.2 mg/dL (ref 0.0–1.0)
pH: 5.5 (ref 5.0–8.0)

## 2019-11-05 MED ORDER — CEPHALEXIN 500 MG PO CAPS: 500.0000 mg | ORAL_CAPSULE | Freq: Two times a day (BID) | ORAL | 0 refills | Status: AC

## 2019-11-05 NOTE — ED Provider Notes (Signed)
[place]    CSN: [id number] Arrival date & time: 11/05/19  1432      History   Chief Complaint Chief Complaint  Patient presents with   Abdominal Pain    HPI Isabel Shaw is a 49 y.o. female.   Pt presents with RLQ abdominal pain radiating into right upper leg and right posterior leg pain x 1 week. Symptoms worse when walking and some movements. Ibuprofen 800mg  taken without relief. Reports nausea. LMP just ended. Denies fevers, chills, vomiting, diarrhea, changes in appetite. No falls or injuries. Some urinary frequency but no dysuria, hematuria.   ROS per HPI      Past Medical History:  Diagnosis Date   Asthma    Fibromyalgia    Hypertension    Seizures ([place])    Uterine fibroid     Patient Active Problem List   Diagnosis Date Noted   Fibromyalgia 01/04/2017   Allergic rhinitis 01/04/2017   Asthma in adult, mild persistent, uncomplicated 26/71/2458   Restless leg syndrome 01/04/2017   Fibroid 09/26/2016   Dysmenorrhea 09/26/2016   Hypertension 09/26/2016   Plantar fasciitis, bilateral 02/24/2015   Deformity of metatarsal bone of left foot 12/20/2013   Eschar of foot 09/17/2013   Foot lesion 09/17/2013   Bunion of great toe of right foot 05/28/2013   TTS (tarsal tunnel syndrome) 10/31/2012   Pain in joint, ankle and foot 08/08/2012    Past Surgical History:  Procedure Laterality Date   BUNIONECTOMY Right 12/25/2015   Heel spur resect w/Plantar Fasciotomy Right 09-22-03   Right foot   NEURECTOMY FOOT Right 01/26/04   Right foot, Plantar calc.   Neuroplasty digital n. Right 07/02/07   Right x 3   TARSAL TUNNEL RELEASE Right 01/26/04   Right foot   TARSAL TUNNEL RELEASE Right 07/02/07   Right foot    OB History    Gravida  5   Para  4   Term      Preterm  4   AB  1   Living  4     SAB  1   TAB      Ectopic      Multiple      Live Births               Home Medications    Prior to  Admission medications   Medication Sig Start Date End Date Taking? Authorizing Provider  albuterol (PROVENTIL HFA;VENTOLIN HFA) 108 (90 Base) MCG/ACT inhaler Inhale 2 puffs into the lungs every 4 (four) hours as needed for wheezing or shortness of breath. 12/04/15  Yes Oxford, Orson Ape, FNP  amitriptyline (ELAVIL) 50 MG tablet Take 50 mg by mouth at bedtime.   Yes [provider]  fluticasone (FLONASE) 50 MCG/ACT nasal spray Place 2 sprays into both nostrils daily. 04/21/18  Yes Yu, Amy V, PA-C  Fluticasone-Salmeterol (ADVAIR) 250-50 MCG/DOSE AEPB Inhale 1 puff into the lungs every 12 (twelve) hours.   Yes [provider]  gabapentin (NEURONTIN) 300 MG capsule Take 600 mg by mouth 2 (two) times daily.   Yes [provider]  hydrochlorothiazide (HYDRODIURIL) 12.5 MG tablet Take 12.5 mg by mouth daily. 09/20/19  Yes [provider]  ibuprofen (ADVIL,MOTRIN) 800 MG tablet Take 800 mg by mouth every 8 (eight) hours as needed.   Yes [provider]  ipratropium (ATROVENT) 0.06 % nasal spray Place 2 sprays into both nostrils 4 (four) times daily. 04/21/18  Yes Ok Edwards,  PA-C  levETIRAcetam (KEPPRA) 500 MG tablet Take 500 mg by mouth 2 (two) times daily.   Yes [provider]  loratadine (CLARITIN) 10 MG tablet Take 10 mg by mouth at bedtime.   Yes [provider]  omeprazole (PRILOSEC) 20 MG capsule Take 20 mg by mouth 2 (two) times daily. 09/17/19  Yes [provider]  Vitamin D, Ergocalciferol, (DRISDOL) 1.25 MG (50000 UNIT) CAPS capsule Take 50,000 Units by mouth once a week. 09/19/19  Yes [provider]  albuterol (PROVENTIL) (2.5 MG/3ML) 0.083% nebulizer solution Take 3 mLs (2.5 mg total) by nebulization every 6 (six) hours as needed for wheezing or shortness of breath. Patient not taking: Reported on 10/17/2019 04/21/18   Ok Edwards, PA-C  cephALEXin (KEFLEX) 500 MG capsule Take 1 capsule (500 mg total) by mouth 2 (two) times  daily for 5 days. 11/05/19 11/10/19  Loura Halt A, NP  triamcinolone ointment (KENALOG) 0.1 % APPLY EXTERNALLY TO THE AFFECTED AREA TWICE DAILY AS NEEDED 07/08/19   [provider]    Family History History reviewed. No pertinent family history.  Social History Social History   Tobacco Use   Smoking status: Never Smoker   Smokeless tobacco: Never Used  Scientific laboratory technician Use: Never used  Substance Use Topics   Alcohol use: No    Alcohol/week: 0.0 standard drinks   Drug use: No     Allergies   Patient has no known allergies.   Review of Systems Review of Systems  Constitutional: Negative for appetite change, chills, fatigue, fever and unexpected weight change.  Respiratory: Negative.   Cardiovascular: Negative.   Gastrointestinal: Positive for abdominal pain and nausea. Negative for blood in stool, constipation, diarrhea and vomiting.       RLQ  Genitourinary: Positive for frequency. Negative for difficulty urinating, flank pain, hematuria, pelvic pain, urgency, vaginal bleeding, vaginal discharge and vaginal pain.  Musculoskeletal: Negative for back pain and neck pain.  Neurological: Negative.      Physical Exam Triage Vital Signs ED Triage Vitals  Enc Vitals Group     BP 11/05/19 1451 (!) 146/96     Pulse Rate 11/05/19 1451 86     Resp 11/05/19 1451 16     Temp 11/05/19 1451 99.2 F (37.3 C)     Temp Source 11/05/19 1451 Oral     SpO2 11/05/19 1451 99 %     Weight --      Height --      Head Circumference --      Peak Flow --      Pain Score 11/05/19 1455 10     Pain Loc --      Pain Edu? --      Excl. in [place]? --    No data found.  Updated Vital Signs BP (!) 146/96 (BP Location: Left Arm)    Pulse 86    Temp 99.2 F (37.3 C) (Oral)    Resp 16    LMP 10/24/2019    SpO2 99%   Visual Acuity Right Eye Distance:   Left Eye Distance:   Bilateral Distance:    Right Eye Near:   Left Eye Near:    Bilateral Near:     Physical  Exam Constitutional:      General: She is not in acute distress.    Appearance: She is well-developed and normal weight.  HENT:     Head: Normocephalic.  Abdominal:     General: Abdomen is  flat. Bowel sounds are normal. There are no signs of injury.     Palpations: Abdomen is soft.     Tenderness: There is abdominal tenderness in the right upper quadrant and right lower quadrant.  Musculoskeletal:     Cervical back: Normal.     Thoracic back: Normal.     Lumbar back: Positive right straight leg raise test. Negative left straight leg raise test.     Comments: Right upper anterior and posterior thigh pain  Skin:    General: Skin is warm and dry.  Neurological:     Mental Status: She is alert.      UC Treatments / Results  Labs (all labs ordered are listed, but only abnormal results are displayed) Labs Reviewed  POCT URINALYSIS DIP (DEVICE) - Abnormal; Notable for the following components:      Result Value   Nitrite POSITIVE (*)    Leukocytes,Ua SMALL (*)    All other components within normal limits  URINE CULTURE  POC URINE PREG, ED    EKG   Radiology No results found.  Procedures Procedures (including critical care time)  Medications Ordered in UC Medications - No data to display  Initial Impression / Assessment and Plan / UC Course  I have reviewed the triage vital signs and the nursing notes.  Pertinent labs & imaging results that were available during my care of the patient were reviewed by me and considered in my medical decision making (see chart for details).     UTI Small leuks and positive nitrates. Sending for culture. Ibuprofen for pain as needed.  Push fluids.  Follow-up as needed.  Keflex for treatment  Final Clinical Impressions(s) / UC Diagnoses   Final diagnoses:  Lower urinary tract infectious disease     Discharge Instructions     Treating you for UTI Take the medication as prescribed Ibuprofen for pain as needed. Drink plenty  of water.  Follow up as needed for continued or worsening symptoms      ED Prescriptions    Medication Sig Dispense Auth. Provider   cephALEXin (KEFLEX) 500 MG capsule Take 1 capsule (500 mg total) by mouth 2 (two) times daily for 5 days. 10 capsule Loura Halt A, NP     PDMP not reviewed this encounter.   Loura Halt A, NP 11/06/19 [phone]

## 2019-11-05 NOTE — Discharge Instructions (Signed)
Treating you for UTI Take the medication as prescribed Ibuprofen for pain as needed. Drink plenty of water.  Follow up as needed for continued or worsening symptoms

## 2019-11-05 NOTE — ED Triage Notes (Signed)
Pt c/o RLQ pain intermittently for approx 1 week with nausea; states pain kept her awake all night last night. Also reports urinary urgency, but denies hematuria, frequency or burning with urination.   Denies v/d, constipation, fever, chills, or vag discharge.  Pt has been taking 800mg  ibuprofen and Midol w/o improvement of symptoms. Last dose was last night. Pt states she was recently dx and tx for uterine fibroids; pt states "this is the worst it's ever hurt".

## 2019-11-07 LAB — URINE CULTURE: Culture: >=100000 — AB

## 2019-11-25 ENCOUNTER — Telehealth (INDEPENDENT_AMBULATORY_CARE_PROVIDER_SITE_OTHER): Payer: Medicare Other | Admitting: *Deleted

## 2019-11-25 DIAGNOSIS — R109 Unspecified abdominal pain: Secondary | ICD-10-CM

## 2019-11-25 DIAGNOSIS — Z8742 Personal history of other diseases of the female genital tract: Secondary | ICD-10-CM

## 2019-11-25 DIAGNOSIS — R350 Frequency of micturition: Secondary | ICD-10-CM

## 2019-11-25 DIAGNOSIS — R3915 Urgency of urination: Secondary | ICD-10-CM

## 2019-11-25 NOTE — Telephone Encounter (Signed)
Called patient ans she reports she took the Keflex that she was prescribed on 6/22. She reports she feels like her Symptoms returned about 2 days ago and she is experiencing continuous abdominal pain that she describes as unbearable. She reports no pain when she urinates. She has frequency and urgency that has been going on since the UTI.   She recently had a cycle and still having abdominal pain. She reports she has difficulty getting to use the restroom while at work.   Message to front office to call and get patient in for U/A and Vaginal Swab. She is off on Thursday and Friday this week.   Encouraged patient to drink plenty of fluids, Cranberry juce and AZO for discomfort until she can be seen to verify if UTI still present. She does have a diagnosed E Coli UTI on 6/22.

## 2019-11-25 NOTE — Telephone Encounter (Signed)
Pt left VM message stating that she has a UTI and having pain. She stated she is returning to work tomorrow and requests Rx to be sent to [facility] on [address].

## 2019-11-28 ENCOUNTER — Ambulatory Visit (INDEPENDENT_AMBULATORY_CARE_PROVIDER_SITE_OTHER): Payer: Medicare Other

## 2019-11-28 ENCOUNTER — Other Ambulatory Visit (HOSPITAL_COMMUNITY)
Admission: RE | Admit: 2019-11-28 | Discharge: 2019-11-28 | Disposition: A | Discharge status: Home / Self Care | Payer: Medicare Other | Source: Ambulatory Visit | Attending: Obstetrics and Gynecology | Admitting: Obstetrics and Gynecology

## 2019-11-28 ENCOUNTER — Other Ambulatory Visit: Payer: Self-pay

## 2019-11-28 VITALS — BP 111/83 | HR 94 | Temp 98.3°F | Wt 156.0 lb

## 2019-11-28 DIAGNOSIS — Z202 Contact with and (suspected) exposure to infections with a predominantly sexual mode of transmission: Secondary | ICD-10-CM | POA: Diagnosis present

## 2019-11-28 DIAGNOSIS — N390 Urinary tract infection, site not specified: Secondary | ICD-10-CM

## 2019-11-28 DIAGNOSIS — N760 Acute vaginitis: Secondary | ICD-10-CM

## 2019-11-28 LAB — POCT URINALYSIS DIP (DEVICE)
Glucose, UA: NEGATIVE mg/dL
Ketones, ur: NEGATIVE mg/dL
Leukocytes,Ua: NEGATIVE
Nitrite: NEGATIVE
Protein, ur: 30 mg/dL — AB
Specific Gravity, Urine: >=1.03 (ref 1.005–1.030)
Urobilinogen, UA: 1 mg/dL (ref 0.0–1.0)
pH: 6 (ref 5.0–8.0)

## 2019-11-28 NOTE — Addendum Note (Signed)
Addended by: Bethanne Ginger on: 11/28/2019 04:15 PM   Modules accepted: Orders

## 2019-11-28 NOTE — Progress Notes (Addendum)
Nurse visit for UA & Self Swab, pt has been having symptoms such as dark urine with pain on right side, pain is unbearable, cant sleep at night. Went to Urgent care on 11/05/19, was treated for UTI before results came back. Was Rx Keflex 500 mg, pt states does not help pain.Spoke with Dr. Ilda Basset, wanted Urine Culture ordered. Advise that test should come back in a couple of days, we will call her with results since she does not have My Chart. Pt verbalized understanding.

## 2019-11-29 LAB — CERVICOVAGINAL ANCILLARY ONLY
Bacterial Vaginitis (gardnerella): POSITIVE — AB
Candida Glabrata: NEGATIVE
Candida Vaginitis: NEGATIVE
Chlamydia: NEGATIVE
Comment: NEGATIVE
Comment: NEGATIVE
Comment: NEGATIVE
Comment: NEGATIVE
Comment: NEGATIVE
Comment: NORMAL
Neisseria Gonorrhea: NEGATIVE
Trichomonas: NEGATIVE

## 2019-11-29 LAB — URINE CULTURE: Organism ID, Bacteria: NO GROWTH

## 2019-12-03 NOTE — Progress Notes (Signed)
Patient was assessed and managed by nursing staff during this encounter. I have reviewed the chart and agree with the documentation and plan. I have also made any necessary editorial changes.  Aletha Halim, MD 12/03/2019 8:19 AM

## 2020-04-29 ENCOUNTER — Emergency Department (HOSPITAL_COMMUNITY): Payer: Medicare Other

## 2020-04-29 ENCOUNTER — Emergency Department (HOSPITAL_COMMUNITY)
Admission: EM | Admit: 2020-04-29 | Discharge: 2020-04-29 | Discharge status: Against Medical Advice | Payer: Medicare Other | Attending: Emergency Medicine | Admitting: Emergency Medicine

## 2020-04-29 ENCOUNTER — Encounter (HOSPITAL_COMMUNITY): Payer: Self-pay

## 2020-04-29 DIAGNOSIS — J45909 Unspecified asthma, uncomplicated: Secondary | ICD-10-CM | POA: Insufficient documentation

## 2020-04-29 DIAGNOSIS — Z7951 Long term (current) use of inhaled steroids: Secondary | ICD-10-CM | POA: Insufficient documentation

## 2020-04-29 DIAGNOSIS — Z79899 Other long term (current) drug therapy: Secondary | ICD-10-CM | POA: Diagnosis not present

## 2020-04-29 DIAGNOSIS — R059 Cough, unspecified: Secondary | ICD-10-CM | POA: Diagnosis present

## 2020-04-29 DIAGNOSIS — I10 Essential (primary) hypertension: Secondary | ICD-10-CM | POA: Insufficient documentation

## 2020-04-29 MED ORDER — ALUM & MAG HYDROXIDE-SIMETH 200-200-20 MG/5ML PO SUSP
30.0000 mL | Freq: Once | ORAL | Status: DC
  Filled 2020-04-29: qty 30

## 2020-04-29 MED ORDER — LIDOCAINE VISCOUS HCL 2 % MT SOLN
15.0000 mL | Freq: Once | OROMUCOSAL | Status: DC
  Filled 2020-04-29: qty 15

## 2020-04-29 MED ORDER — BENZONATATE 100 MG PO CAPS: 100.0000 mg | ORAL_CAPSULE | Freq: Three times a day (TID) | ORAL | 0 refills | Status: DC

## 2020-04-29 NOTE — ED Notes (Signed)
Pt left AMA °

## 2020-04-29 NOTE — ED Triage Notes (Signed)
Pt from home with ems for cold symptoms, negative COVID test last week, using OTC medications without relief. Resp e.u

## 2020-04-29 NOTE — ED Provider Notes (Signed)
[facility] EMERGENCY DEPARTMENT Provider Note   CSN: [id number] Arrival date & time: 04/29/20  1342     History Chief Complaint  Patient presents with  . Cough  . Nasal Congestion    Isabel Shaw is a 49 y.o. female with PMHx HTN, Asthma, Fibromyalgia who presents to the ED today via EMS with complaint of persistent cough x 1 week.  Patient reports that this morning she was at work and reports that the heat was not on.  States that this caused her to go into a coughing fit when she then suddenly began to feel a burning sensation in her chest which caused her to not feel like she could breathe.  She called EMS and was transported here.  Patient states that while in the waiting room for several hours this feeling has dissipated however she still has somewhat of a burning.  She states this feels similar to acid reflux, she takes omeprazole daily and believes she took it today.  No nausea or vomiting.  She denies any chest pain.  She does mention that she had a negative antigen Covid test last week and is unconcerned regarding Covid today.  She mostly came in because she had this burning sensation after a coughing fit.  She denies any recent sick contacts.  She has received 1 dose of the Pfizer vaccine "when it first came out" however decided not to receive second vaccine due to having side effects.  Denies fevers, chills, abdominal pain, nausea, vomiting, diarrhea, any other associated symptoms.   The history is provided by the patient, medical records and the EMS personnel.       Past Medical History:  Diagnosis Date  . Asthma   . Fibromyalgia   . Hypertension   . Seizures ([place])   . Uterine fibroid     Patient Active Problem List   Diagnosis Date Noted  . Possible exposure to STD 11/28/2019  . Fibromyalgia 01/04/2017  . Allergic rhinitis 01/04/2017  . Asthma in adult, mild persistent, uncomplicated 81/82/9937  . Restless leg syndrome 01/04/2017  . Fibroid  09/26/2016  . Dysmenorrhea 09/26/2016  . Hypertension 09/26/2016  . Plantar fasciitis, bilateral 02/24/2015  . 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 Eschar of foot 09/17/2013  . Foot lesion 09/17/2013  . Bunion of great toe of right foot 05/28/2013  . TTS (tarsal tunnel syndrome) 10/31/2012  . Pain in joint, ankle and foot 08/08/2012    Past Surgical History:  Procedure Laterality Date  . BUNIONECTOMY Right 12/25/2015  . Heel spur resect w/Plantar Fasciotomy Right 09-22-03   Right foot  . NEURECTOMY FOOT Right 01/26/04   Right foot, Plantar calc.  Marland Kitchen Neuroplasty digital n. Right 07/02/07   Right x 3  . TARSAL TUNNEL RELEASE Right 01/26/04   Right foot  . TARSAL TUNNEL RELEASE Right 07/02/07   Right foot     OB History    Gravida  5   Para  4   Term      Preterm  4   AB  1   Living  4     SAB  1   IAB      Ectopic      Multiple      Live Births              No family history on file.  Social History   Tobacco Use  . Smoking status: Never Smoker  . Smokeless tobacco:  Never Used  Vaping Use  . Vaping Use: Never used  Substance Use Topics  . Alcohol use: No    Alcohol/week: 0.0 standard drinks  . Drug use: No    Home Medications Prior to Admission medications   Medication Sig Start Date End Date Taking? Authorizing Provider  albuterol (PROVENTIL HFA;VENTOLIN HFA) 108 (90 Base) MCG/ACT inhaler Inhale 2 puffs into the lungs every 4 (four) hours as needed for wheezing or shortness of breath. 12/04/15   Lysbeth Penner, FNP  albuterol (PROVENTIL) (2.5 MG/3ML) 0.083% nebulizer solution Take 3 mLs (2.5 mg total) by nebulization every 6 (six) hours as needed for wheezing or shortness of breath. Patient not taking: Reported on 10/17/2019 04/21/18   Ok Edwards, PA-C  amitriptyline (ELAVIL) 50 MG tablet Take 50 mg by mouth at bedtime.    [provider]  benzonatate (TESSALON) 100 MG capsule Take 1 capsule (100 mg total) by mouth every  8 (eight) hours. 04/29/20   Cloyde Oregel, PA-C  fluticasone (FLONASE) 50 MCG/ACT nasal spray Place 2 sprays into both nostrils daily. 04/21/18   Tasia Catchings, Amy V, PA-C  Fluticasone-Salmeterol (ADVAIR) 250-50 MCG/DOSE AEPB Inhale 1 puff into the lungs every 12 (twelve) hours.    [provider]  gabapentin (NEURONTIN) 300 MG capsule Take 600 mg by mouth 2 (two) times daily.    [provider]  hydrochlorothiazide (HYDRODIURIL) 12.5 MG tablet Take 12.5 mg by mouth daily. 09/20/19   [provider]  ibuprofen (ADVIL,MOTRIN) 800 MG tablet Take 800 mg by mouth every 8 (eight) hours as needed.    [provider]  ipratropium (ATROVENT) 0.06 % nasal spray Place 2 sprays into both nostrils 4 (four) times daily. 04/21/18   Tasia Catchings, Amy V, PA-C  levETIRAcetam (KEPPRA) 500 MG tablet Take 500 mg by mouth 2 (two) times daily.    [provider]  loratadine (CLARITIN) 10 MG tablet Take 10 mg by mouth at bedtime.    [provider]  omeprazole (PRILOSEC) 20 MG capsule Take 20 mg by mouth 2 (two) times daily. 09/17/19   [provider]  triamcinolone ointment (KENALOG) 0.1 % APPLY EXTERNALLY TO THE AFFECTED AREA TWICE DAILY AS NEEDED 07/08/19   [provider]  Vitamin D, Ergocalciferol, (DRISDOL) 1.25 MG (50000 UNIT) CAPS capsule Take 50,000 Units by mouth once a week. 09/19/19   [provider]    Allergies    Patient has no known allergies.  Review of Systems   Review of Systems  Constitutional: Negative for chills, diaphoresis and fever.  Respiratory: Positive for cough and shortness of breath.   Cardiovascular: Negative for chest pain.  Gastrointestinal: Negative for abdominal pain, nausea and vomiting.  All other systems reviewed and are negative.   Physical Exam Updated Vital Signs BP 105/77 (BP Location: Right Arm)   Pulse 78   Temp 98.2 F (36.8 C) (Oral)   Resp 16   SpO2 100%   Physical Exam Vitals and nursing note reviewed.   Constitutional:      Appearance: She is not ill-appearing or diaphoretic.     Comments: Ambulated from waiting room without difficulty  HENT:     Head: Normocephalic and atraumatic.  Eyes:     Conjunctiva/sclera: Conjunctivae normal.  Cardiovascular:     Rate and Rhythm: Normal rate and regular rhythm.     Pulses: Normal pulses.  Pulmonary:     Effort: Pulmonary effort is normal.     Breath sounds: Normal breath sounds. No  wheezing, rhonchi or rales.     Comments: Speaking in full sentences without difficulty. Mild cough in the room. No tachypnea. LCTAB. No wheezing.  Abdominal:     Tenderness: There is no abdominal tenderness. There is no guarding or rebound.  Skin:    General: Skin is warm and dry.     Coloration: Skin is not jaundiced.  Neurological:     Mental Status: She is alert.     ED Results / Procedures / Treatments   Labs (all labs ordered are listed, but only abnormal results are displayed) Labs Reviewed - No data to display  EKG EKG Interpretation  Date/Time:  Wednesday April 29 2020 13:45:47 EST Ventricular Rate:  83 PR Interval:  136 QRS Duration: 88 QT Interval:  374 QTC Calculation: 439 R Axis:   42 Text Interpretation: Normal sinus rhythm Baseline wander Otherwise within normal limits Confirmed by Carmin Muskrat [phone]) on 04/29/2020 10:07:35 PM   Radiology DG Chest 2 View  Result Date: 04/29/2020 CLINICAL DATA:  sob EXAM: CHEST - 2 VIEW COMPARISON:  06/12/2015 and prior. FINDINGS: No focal consolidation. No pneumothorax or pleural effusion. Mild peribronchial cuffing. Cardiomediastinal silhouette is within normal limits. No acute osseous abnormality. IMPRESSION: No focal consolidation. Mild peribronchial cuffing may reflect sequela of bronchitis or asthma. Electronically Signed   By: Primitivo Gauze M.D.   On: 04/29/2020 14:20    Procedures Procedures (including critical care time)  Medications Ordered in ED Medications  alum & mag  hydroxide-simeth (MAALOX/MYLANTA) 200-200-20 MG/5ML suspension 30 mL (has no administration in time range)    And  lidocaine (XYLOCAINE) 2 % viscous mouth solution 15 mL (has no administration in time range)    ED Course  I have reviewed the triage vital signs and the nursing notes.  Pertinent labs & imaging results that were available during my care of the patient were reviewed by me and considered in my medical decision making (see chart for details).    MDM Rules/Calculators/A&P                          49 year old female presents to the ED today with complaint of a burning sensation after a coughing spell earlier today at work.  Felt like she could not get a deep breath in so she called EMS who brought her to the ED.  Patient was placed in the waiting room where she had an EKG not any acute ischemic changes.  She also had a chest x-ray which is negative for any signs of infection at this time.  On arrival to the ED her vitals are stable.  She is afebrile, nontachycardic nontachypneic.  When she is brought back to the room I personally visualized patient ambulating without difficulty.  When I enter the room she is not working hard to breathe.  Her lungs are clear to auscultation bilaterally without any signs of wheezing. Does not seem consistent with asthma exacerbation. She is able to speak in full sentences without difficulty and satting 100% on room air.  She still complains of a burning sensation which she states feels like acid reflux, takes omeprazole daily and believe she took it earlier today.  No abdominal pain on exam.  No nausea or vomiting.  Will provide GI cocktail at this time to see if this helps with patient's burning sensation.  Have discussed getting a Covid test at this time given patient has only received 1 dose of the Pfizer vaccine early  in 2021 however she states she is not interested in getting tested today.  Will reevaluate patient after GI cocktail.  If improvement in  symptoms patient can be discharged home with PCP follow up. Will provide tessalon perles.   At shift change case signed out to attending physician Dr. Vanita Panda who will evaluate patient after GI cocktail. If improvement in her burning sensation pt can be discharged home.   This note was prepared using Dragon voice recognition software and may include unintentional dictation errors due to the inherent limitations of voice recognition software.  Final Clinical Impression(s) / ED Diagnoses Final diagnoses:  Cough    Rx / DC Orders ED Discharge Orders         Ordered    benzonatate (TESSALON) 100 MG capsule  Every 8 hours        04/29/20 2139           Eustaquio Maize, PA-C 04/29/20 2218    Carmin Muskrat, MD 05/07/20 [phone]

## 2020-04-29 NOTE — ED Notes (Signed)
Pt stated that she was leaving because she had been here 10 hrs.  EMT advised that provider would be there soon.  Pt said she had to leave now.  Pt asked for the exit and left AMA.

## 2020-04-29 NOTE — Discharge Instructions (Addendum)
Please pick up cough medication and take as prescribed Follow up with your PCP regarding your ED visit today You have decided not to have a COVID/flu test done at this time. It is recommend that you self isolate for 10 days from symptom onset.   Return to the ED for any worsening symptoms including shortness of breath, chest pain, nausea, vomiting, passing out, or any other new/concerning symptoms.

## 2020-04-30 ENCOUNTER — Encounter (HOSPITAL_COMMUNITY): Payer: Self-pay

## 2020-04-30 ENCOUNTER — Ambulatory Visit (HOSPITAL_COMMUNITY)
Admission: EM | Admit: 2020-04-30 | Discharge: 2020-04-30 | Disposition: A | Discharge status: Home / Self Care | Payer: Medicare Other

## 2020-04-30 ENCOUNTER — Other Ambulatory Visit: Payer: Self-pay

## 2020-04-30 DIAGNOSIS — J069 Acute upper respiratory infection, unspecified: Secondary | ICD-10-CM | POA: Diagnosis not present

## 2020-04-30 DIAGNOSIS — R059 Cough, unspecified: Secondary | ICD-10-CM | POA: Diagnosis not present

## 2020-04-30 NOTE — ED Provider Notes (Signed)
[place]    CSN: [id number] Arrival date & time: 04/30/20  1039      History   Chief Complaint Chief Complaint  Patient presents with  . Cough    HPI Isabel Shaw is a 49 y.o. female with past medical history of asthma, fibromyalgia, hypertension who presents to urgent care with complaints of cough.  Patient was actually seen and evaluated in the emergency department last night however she is telling me that she was never seen.  She left prior to receiving discharge paperwork.  Patient reports URI symptoms last week with continued persistent cough and fatigue.  She denies any recent fever or chills, no decrease in p.o. intake, no headache, chest pain, SOB.  Cough described as nonproductive.  Patient states she had Covid test done last week and it was negative.  Chest x-ray done in ED last evening with no evidence of pneumonia.  She has refused Covid testing today.   Past Medical History:  Diagnosis Date  . Asthma   . Fibromyalgia   . Hypertension   . Seizures ([place])   . Uterine fibroid     Patient Active Problem List   Diagnosis Date Noted  . Possible exposure to STD 11/28/2019  . Fibromyalgia 01/04/2017  . Allergic rhinitis 01/04/2017  . Asthma in adult, mild persistent, uncomplicated 66/[id number]/9357  . Restless leg syndrome 01/04/2017  . Fibroid 09/26/2016  . Dysmenorrhea 09/26/2016  . Hypertension 09/26/2016  . Plantar fasciitis, bilateral 02/24/2015  . 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 Eschar of foot 09/17/2013  . Foot lesion 09/17/2013  . Bunion of great toe of right foot 05/28/2013  . TTS (tarsal tunnel syndrome) 10/31/2012  . Pain in joint, ankle and foot 08/08/2012    Past Surgical History:  Procedure Laterality Date  . BUNIONECTOMY Right 12/25/2015  . Heel spur resect w/Plantar Fasciotomy Right 09-22-03   Right foot  . NEURECTOMY FOOT Right 01/26/04   Right foot, Plantar calc.  Marland Kitchen Neuroplasty digital n. Right 07/02/07    Right x 3  . TARSAL TUNNEL RELEASE Right 01/26/04   Right foot  . TARSAL TUNNEL RELEASE Right 07/02/07   Right foot    OB History    Gravida  5   Para  4   Term      Preterm  4   AB  1   Living  4     SAB  1   IAB      Ectopic      Multiple      Live Births               Home Medications    Prior to Admission medications   Medication Sig Start Date End Date Taking? Authorizing Provider  albuterol (PROVENTIL HFA;VENTOLIN HFA) 108 (90 Base) MCG/ACT inhaler Inhale 2 puffs into the lungs every 4 (four) hours as needed for wheezing or shortness of breath. 12/04/15   Lysbeth Penner, FNP  albuterol (PROVENTIL) (2.5 MG/3ML) 0.083% nebulizer solution Take 3 mLs (2.5 mg total) by nebulization every 6 (six) hours as needed for wheezing or shortness of breath. Patient not taking: Reported on 10/17/2019 04/21/18   Ok Edwards, PA-C  amitriptyline (ELAVIL) 50 MG tablet Take 50 mg by mouth at bedtime.    [provider]  benzonatate (TESSALON) 100 MG capsule Take 1 capsule (100 mg total) by mouth every 8 (eight) hours. 04/29/20   Alroy Bailiff, Margaux, PA-C  fluticasone (FLONASE) 50  MCG/ACT nasal spray Place 2 sprays into both nostrils daily. 04/21/18   Tasia Catchings, Amy V, PA-C  Fluticasone-Salmeterol (ADVAIR) 250-50 MCG/DOSE AEPB Inhale 1 puff into the lungs every 12 (twelve) hours.    [provider]  gabapentin (NEURONTIN) 300 MG capsule Take 600 mg by mouth 2 (two) times daily.    [provider]  hydrochlorothiazide (HYDRODIURIL) 12.5 MG tablet Take 12.5 mg by mouth daily. 09/20/19   [provider]  ibuprofen (ADVIL,MOTRIN) 800 MG tablet Take 800 mg by mouth every 8 (eight) hours as needed.    [provider]  ipratropium (ATROVENT) 0.06 % nasal spray Place 2 sprays into both nostrils 4 (four) times daily. 04/21/18   Tasia Catchings, Amy V, PA-C  levETIRAcetam (KEPPRA) 500 MG tablet Take 500 mg by mouth 2 (two) times daily.    [provider]  loratadine  (CLARITIN) 10 MG tablet Take 10 mg by mouth at bedtime.    [provider]  omeprazole (PRILOSEC) 20 MG capsule Take 20 mg by mouth 2 (two) times daily. 09/17/19   [provider]  triamcinolone ointment (KENALOG) 0.1 % APPLY EXTERNALLY TO THE AFFECTED AREA TWICE DAILY AS NEEDED 07/08/19   [provider]  Vitamin D, Ergocalciferol, (DRISDOL) 1.25 MG (50000 UNIT) CAPS capsule Take 50,000 Units by mouth once a week. 09/19/19   [provider]    Family History History reviewed. No pertinent family history.  Social History Social History   Tobacco Use  . Smoking status: Never Smoker  . Smokeless tobacco: Never Used  Vaping Use  . Vaping Use: Never used  Substance Use Topics  . Alcohol use: No    Alcohol/week: 0.0 standard drinks  . Drug use: No     Allergies   Patient has no known allergies.   Review of Systems As stated in HPI otherwise negative   Physical Exam Triage Vital Signs ED Triage Vitals  Enc Vitals Group     BP 04/30/20 1331 99/66     Pulse Rate 04/30/20 1331 76     Resp 04/30/20 1331 16     Temp 04/30/20 1331 98.3 F (36.8 C)     Temp Source 04/30/20 1331 Oral     SpO2 04/30/20 1331 100 %     Weight --      Height --      Head Circumference --      Peak Flow --      Pain Score 04/30/20 1329 5     Pain Loc --      Pain Edu? --      Excl. in [place]? --    No data found.  Updated Vital Signs BP 99/66 (BP Location: Right Arm)   Pulse 76   Temp 98.3 F (36.8 C) (Oral)   Resp 16   LMP 04/08/2020   SpO2 100%   Physical Exam Constitutional:      General: She is not in acute distress.    Appearance: Normal appearance. She is not ill-appearing.  HENT:     Right Ear: Tympanic membrane normal.     Left Ear: Tympanic membrane normal.     Nose: No congestion or rhinorrhea.     Mouth/Throat:     Mouth: Mucous membranes are moist.     Pharynx: Oropharynx is clear. No oropharyngeal exudate or posterior oropharyngeal  erythema.  Eyes:     Extraocular Movements: Extraocular movements intact.     Pupils: Pupils are equal, round, and reactive to light.  Cardiovascular:     Rate and Rhythm: Normal rate and regular rhythm.  Pulmonary:     Effort: Pulmonary effort is normal.     Breath sounds: Normal breath sounds.  Abdominal:     General: Bowel sounds are normal.     Palpations: Abdomen is soft.  Musculoskeletal:        General: Normal range of motion.     Cervical back: Normal range of motion and neck supple.  Skin:    General: Skin is warm and dry.  Neurological:     General: No focal deficit present.     Mental Status: She is alert.     Motor: No weakness.     Deep Tendon Reflexes: Reflexes normal.  Psychiatric:        Behavior: Behavior normal.      UC Treatments / Results  Labs (all labs ordered are listed, but only abnormal results are displayed) Labs Reviewed - No data to display  EKG NSR at 83 bpm no ischemic changes  Radiology DG Chest 2 View  Result Date: 04/29/2020 CLINICAL DATA:  sob EXAM: CHEST - 2 VIEW COMPARISON:  06/12/2015 and prior. FINDINGS: No focal consolidation. No pneumothorax or pleural effusion. Mild peribronchial cuffing. Cardiomediastinal silhouette is within normal limits. No acute osseous abnormality. IMPRESSION: No focal consolidation. Mild peribronchial cuffing may reflect sequela of bronchitis or asthma. Electronically Signed   By: Primitivo Gauze M.D.   On: 04/29/2020 14:20    Procedures Procedures (including critical care time)  Medications Ordered in UC Medications - No data to display  Initial Impression / Assessment and Plan / UC Course  I have reviewed the triage vital signs and the nursing notes.  Pertinent labs & imaging results that were available during my care of the patient were reviewed by me and considered in my medical decision making (see chart for details).  Cough -Suspect lingering symptom of viral URI. -No evidence of  infectious etiology on CXR done last evening in ED -Tessalon Perles as needed -Would like to test patient for Covid 19 however she absolutely refuses to be tested -Follow-up PCP.  Return or go to ED for worsening symptoms  Reviewed expections re: course of current medical issues. Questions answered. Outlined signs and symptoms indicating need for more acute intervention. Pt verbalized understanding. AVS given   Final Clinical Impressions(s) / UC Diagnoses   Final diagnoses:  Cough  Viral URI with cough     Discharge Instructions     Your cough suppressant was sent into [facility] at [facility] and [place] last evening.  I suspect you have a viral illness though cannot rule out Covid since you refused to be tested.  If symptoms worsen you will need to go back to the emergency department.  Please schedule an appointment with with your primary care physician as symptoms may be related to your fibromyalgia     ED Prescriptions    None     PDMP not reviewed this encounter.   Rudolpho Sevin, NP 04/30/20 [phone]

## 2020-04-30 NOTE — ED Triage Notes (Signed)
Pt presents with non productive cough and dizziness for over a week; pt tested negative for covid last Thursday but is having continued symptoms.

## 2020-04-30 NOTE — Discharge Instructions (Addendum)
Your cough suppressant was sent into [facility] at [place] last evening.  I suspect you have a viral illness though cannot rule out Covid since you refused to be tested.  If symptoms worsen you will need to go back to the emergency department.  Please schedule an appointment with with your primary care physician as symptoms may be related to your fibromyalgia

## 2020-06-12 ENCOUNTER — Other Ambulatory Visit: Payer: Self-pay | Admitting: Internal Medicine

## 2020-06-17 LAB — COMPLETE METABOLIC PANEL WITH GFR
AG Ratio: 1.3 (calc) (ref 1.0–2.5)
ALT: 9 U/L (ref 6–29)
AST: 16 U/L (ref 10–35)
Albumin: 4.1 g/dL (ref 3.6–5.1)
Alkaline phosphatase (APISO): 45 U/L (ref 31–125)
BUN: 15 mg/dL (ref 7–25)
CO2: 24 mmol/L (ref 20–32)
Calcium: 9.4 mg/dL (ref 8.6–10.2)
Chloride: 102 mmol/L (ref 98–110)
Creat: 0.68 mg/dL (ref 0.50–1.10)
GFR, Est African American: 119 mL/min/{1.73_m2} (ref >=60)
GFR, Est Non African American: 103 mL/min/{1.73_m2} (ref >=60)
Globulin: 3.2 g/dL (calc) (ref 1.9–3.7)
Glucose, Bld: 92 mg/dL (ref 65–99)
Potassium: 4.2 mmol/L (ref 3.5–5.3)
Sodium: 137 mmol/L (ref 135–146)
Total Bilirubin: 0.7 mg/dL (ref 0.2–1.2)
Total Protein: 7.3 g/dL (ref 6.1–8.1)

## 2020-06-17 LAB — LIPID PANEL
Cholesterol: 171 mg/dL (ref <200)
HDL: 69 mg/dL (ref >=50)
LDL Cholesterol (Calc): 87 mg/dL (calc)
Non-HDL Cholesterol (Calc): 102 mg/dL (calc) (ref <130)
Total CHOL/HDL Ratio: 2.5 (calc) (ref <5.0)
Triglycerides: 61 mg/dL (ref <150)

## 2020-06-17 LAB — B12 AND FOLATE PANEL
Folate: 16.7 ng/mL
Vitamin B-12: 821 pg/mL (ref 200–1100)

## 2020-06-17 LAB — IRON, TOTAL/TOTAL IRON BINDING CAP
%SAT: 3 % (calc) — ABNORMAL LOW (ref 16–45)
Iron: 14 ug/dL — ABNORMAL LOW (ref 40–190)
TIBC: 457 mcg/dL (calc) — ABNORMAL HIGH (ref 250–450)

## 2020-06-17 LAB — CBC
HCT: 26.4 % — ABNORMAL LOW (ref 35.0–45.0)
Hemoglobin: 7.8 g/dL — ABNORMAL LOW (ref 11.7–15.5)
MCH: 19.4 pg — ABNORMAL LOW (ref 27.0–33.0)
MCHC: 29.5 g/dL — ABNORMAL LOW (ref 32.0–36.0)
MCV: 65.7 fL — ABNORMAL LOW (ref 80.0–100.0)
MPV: 11.1 fL (ref 7.5–12.5)
Platelets: 497 10*3/uL — ABNORMAL HIGH (ref 140–400)
RBC: 4.02 10*6/uL (ref 3.80–5.10)
RDW: 18.1 % — ABNORMAL HIGH (ref 11.0–15.0)
WBC: 3.9 10*3/uL (ref 3.8–10.8)

## 2020-06-17 LAB — VITAMIN D 25 HYDROXY (VIT D DEFICIENCY, FRACTURES): Vit D, 25-Hydroxy: 60 ng/mL (ref 30–100)

## 2020-06-17 LAB — TSH: TSH: 1.26 mIU/L

## 2020-06-17 LAB — FERRITIN: Ferritin: 8 ng/mL — ABNORMAL LOW (ref 16–232)

## 2020-06-29 ENCOUNTER — Telehealth: Payer: Self-pay | Admitting: Family Medicine

## 2020-06-29 NOTE — Telephone Encounter (Signed)
Pt states she is having a cycle twice a month. Normal cycle last about a week and then about 2 weeks later she has another cycle with small blood clots for 2-3 days and then bleeding for another week.

## 2020-06-30 NOTE — Telephone Encounter (Addendum)
Called pt and discussed her concerns. She stated that she has been having irregular bleeding for the past 2 years which is becoming more of a problem. This includes bleeding more than one episode within a month as well as passing clots. I advised that pt will need office visit with provider for evaluation. She voiced understanding and stated that she would like to see Dr. Kennon Rounds for the visit. Pt will be notified of appointment details once it has been scheduled.

## 2020-08-03 ENCOUNTER — Ambulatory Visit (INDEPENDENT_AMBULATORY_CARE_PROVIDER_SITE_OTHER): Payer: Medicare Other | Admitting: Family Medicine

## 2020-08-03 ENCOUNTER — Other Ambulatory Visit: Payer: Self-pay

## 2020-08-03 ENCOUNTER — Encounter: Payer: Self-pay | Admitting: Family Medicine

## 2020-08-03 VITALS — BP 109/79 | HR 61 | Ht 59.0 in | Wt 148.0 lb

## 2020-08-03 DIAGNOSIS — N920 Excessive and frequent menstruation with regular cycle: Secondary | ICD-10-CM | POA: Insufficient documentation

## 2020-08-03 DIAGNOSIS — D219 Benign neoplasm of connective and other soft tissue, unspecified: Secondary | ICD-10-CM

## 2020-08-03 DIAGNOSIS — D500 Iron deficiency anemia secondary to blood loss (chronic): Secondary | ICD-10-CM | POA: Insufficient documentation

## 2020-08-03 MED ORDER — NORETHINDRONE ACETATE 5 MG PO TABS: 10.0000 mg | ORAL_TABLET | Freq: Every day | ORAL | 3 refills | Status: DC

## 2020-08-03 NOTE — Assessment & Plan Note (Signed)
Check u/s and needs endometrial sampling. Discussed procedure at length and need to r/o significant pathology. She is unsure. Does not want hysterectomy. Willing to try progestin again--will switch to Aygestin as has less weight gain side effect. Again offered IUD, D and C with endometrial ablation and hysterectomy. Risks of anemia reviewed.

## 2020-08-03 NOTE — Assessment & Plan Note (Signed)
On Iron repletion, discussed possible role of menorrhagia.

## 2020-08-03 NOTE — Progress Notes (Signed)
   Subjective:    Patient ID: Isabel Shaw is a 50 y.o. female presenting with Abnormal Uterine Bleeding  on 08/03/2020  HPI: Reports cycles are every 2 weeks. Looking back she has been reporting similar cycles x 6 years. Initially saw Dr. Nehemiah Shaw 07/2014 with similar reports. Cycles q 2 weeks lasting 4-5 days and not heavy. Was on Megace, but gained 25# and stopped this. Also, did not think it helped her cycles. Previously Hgb was 12.2. She has not had a pelvic sono since 2016. Also, has never had endometrial sampling.Now on iron for Hgb of 7.8. denies hot flashes.   Review of Systems  Constitutional: Negative for chills and fever.  Respiratory: Negative for shortness of breath.   Cardiovascular: Negative for chest pain.  Gastrointestinal: Negative for abdominal pain, nausea and vomiting.  Genitourinary: Negative for dysuria.  Skin: Negative for rash.      Objective:    BP 109/79   Pulse 61   Ht 4\' 11"  (1.499 m)   Wt 148 lb (67.1 kg)   LMP 07/31/2020 (Exact Date)   BMI 29.89 kg/m  Physical Exam Constitutional:      General: She is not in acute distress.    Appearance: She is well-developed.  HENT:     Head: Normocephalic and atraumatic.  Eyes:     General: No scleral icterus. Cardiovascular:     Rate and Rhythm: Normal rate.  Pulmonary:     Effort: Pulmonary effort is normal.  Abdominal:     Palpations: Abdomen is soft.  Musculoskeletal:     Cervical back: Neck supple.  Skin:    General: Skin is warm and dry.  Neurological:     Mental Status: She is alert and oriented to person, place, and time.         Assessment & Plan:   Problem List Items Addressed This Visit      Unprioritized   Fibroid    Previously 2 cm--will check pelvic sonogram      Iron deficiency anemia due to chronic blood loss    On Iron repletion, discussed possible role of menorrhagia.      Relevant Medications   FEROSUL 325 (65 Fe) MG tablet   Menorrhagia with regular cycle -  Primary    Check u/s and needs endometrial sampling. Discussed procedure at length and need to r/o significant pathology. She is unsure. Does not want hysterectomy. Willing to try progestin again--will switch to Aygestin as has less weight gain side effect. Again offered IUD, D and C with endometrial ablation and hysterectomy. Risks of anemia reviewed.      Relevant Medications   norethindrone (AYGESTIN) 5 MG tablet   Other Relevant Orders   US PELVIC COMPLETE WITH TRANSVAGINAL      Return in about 4 weeks (around 08/31/2020) for endometrial biopsy and pap smear.  Donnamae Shaw 08/03/2020 11:22 AM

## 2020-08-03 NOTE — Assessment & Plan Note (Signed)
Previously 2 cm--will check pelvic sonogram

## 2020-08-12 ENCOUNTER — Other Ambulatory Visit: Payer: Self-pay

## 2020-08-12 ENCOUNTER — Ambulatory Visit
Admission: RE | Admit: 2020-08-12 | Discharge: 2020-08-12 | Disposition: A | Discharge status: Home / Self Care | Payer: Medicare Other | Source: Ambulatory Visit | Attending: Family Medicine | Admitting: Family Medicine

## 2020-08-12 DIAGNOSIS — N920 Excessive and frequent menstruation with regular cycle: Secondary | ICD-10-CM | POA: Insufficient documentation

## 2020-08-17 ENCOUNTER — Telehealth: Payer: Self-pay | Admitting: *Deleted

## 2020-08-17 NOTE — Telephone Encounter (Signed)
-----   Message from [name], MD sent at 08/13/2020  7:53 PM EDT ----- U/s shows polyp and fibroid, as possible causes of bleeding. Still need endometrial biopsy or could do a D and C. If not scheduled back with me please schedule.

## 2020-08-17 NOTE — Telephone Encounter (Signed)
I called Isabel Shaw and informed her of [place] results and plan . Patient states her bleeding has stopped since she started the medicine and she thinks she would not like  endometrial biopsy or D&C. I informed her Dr. Kennon Rounds will review results in more detail and follow up on how she is doing and discuss plan of care with her at her next appointment on 09/02/20 at 1:55 and can do endometrial biopsy if needed . She voices understanding. Khristian Seals,RN

## 2020-09-02 ENCOUNTER — Telehealth: Payer: Self-pay | Admitting: *Deleted

## 2020-09-02 ENCOUNTER — Ambulatory Visit (INDEPENDENT_AMBULATORY_CARE_PROVIDER_SITE_OTHER): Payer: Medicare Other | Admitting: Family Medicine

## 2020-09-02 ENCOUNTER — Encounter: Payer: Self-pay | Admitting: Family Medicine

## 2020-09-02 ENCOUNTER — Encounter: Payer: Self-pay | Admitting: *Deleted

## 2020-09-02 DIAGNOSIS — N920 Excessive and frequent menstruation with regular cycle: Secondary | ICD-10-CM | POA: Diagnosis not present

## 2020-09-02 NOTE — Progress Notes (Signed)
   Subjective:    Patient ID: Isabel Shaw is a 50 y.o. female presenting with Follow-up  on 09/02/2020  HPI: Here for f/u bleeding. Has not had an endometrial sampling and declines again today. U/s results reviewed which shows probable prolapsing endometrial polyp.The Aygestin has helped. She is not interested in hysterectomy at this time.  Review of Systems  Constitutional: Negative for chills and fever.  Respiratory: Negative for shortness of breath.   Cardiovascular: Negative for chest pain.  Gastrointestinal: Negative for abdominal pain, nausea and vomiting.  Genitourinary: Negative for dysuria.  Skin: Negative for rash.      Objective:    BP 112/75   Pulse 81   Ht 4\' 11"  (1.499 m)   Wt 153 lb (69.4 kg)   LMP 08/30/2020   BMI 30.90 kg/m  Physical Exam Constitutional:      General: She is not in acute distress.    Appearance: She is well-developed.  HENT:     Head: Normocephalic and atraumatic.  Eyes:     General: No scleral icterus. Cardiovascular:     Rate and Rhythm: Normal rate.  Pulmonary:     Effort: Pulmonary effort is normal.  Abdominal:     Palpations: Abdomen is soft.  Musculoskeletal:     Cervical back: Neck supple.  Skin:    General: Skin is warm and dry.  Neurological:     Mental Status: She is alert and oriented to person, place, and time.         Assessment & Plan:   Problem List Items Addressed This Visit      Unprioritized   Menorrhagia with regular cycle    Will book for D & C with Hysteroscopic resection of both fibroid and polyp. This should help alleviate bleeding. Risks include but are not limited to bleeding, infection, injury to surrounding structures, including bowel, bladder and ureters, blood clots, and death.  Likelihood of success is high.           Return in about 8 weeks (around 10/28/2020) for postop check, virtual.  [name] 09/03/2020 8:49 AM

## 2020-09-02 NOTE — Telephone Encounter (Signed)
All to patient regarding surgery date.  Scheduled for 09-30-20 at 0830 at [facility]. Patient is unable to accept date until she is able to speak to son for transportation. States will not be able to arrive at 0630 due to son's work schedule. She will review options with son and requests call back tomorrow.

## 2020-09-02 NOTE — Patient Instructions (Signed)
Hysteroscopy Hysteroscopy is a procedure used to look inside a woman's womb (uterus). This may be done for various reasons, including:  To look for tumors and other growths in the uterus.  To evaluate abnormal bleeding, fibroid tumors, polyps, scar tissue, or uterine cancer.  To determine why a woman is unable to get pregnant or has had repeated pregnancy losses.  To locate an IUD (intrauterine device).  To place a birth control device into the fallopian tubes. During this procedure, a thin, flexible tube with a small light and camera (hysteroscope) is used to examine the uterus. The camera sends images to a monitor in the room so that your health care provider can view the inside of your uterus. A hysteroscopy should be done right after a menstrual period. Tell a health care provider about:  Any allergies you have.  All medicines you are taking, including vitamins, herbs, eye drops, creams, and over-the-counter medicines.  Any problems you or family members have had with anesthetic medicines.  Any blood disorders you have.  Any surgeries you have had.  Any medical conditions you have.  Whether you are pregnant or may be pregnant.  Whether you have been diagnosed with an STI (sexually transmitted infection) or you think you have an STI. What are the risks? Generally, this is a safe procedure. However, problems may occur, including:  Excessive bleeding.  Infection.  Damage to the uterus or other structures or organs.  Allergic reaction to medicines or fluids that are used in the procedure. What happens before the procedure? Staying hydrated Follow instructions from your health care provider about hydration, which may include:  Up to 2 hours before the procedure - you may continue to drink clear liquids, such as water, clear fruit juice, black coffee, and plain tea. Eating and drinking restrictions Follow instructions from your health care provider about eating and  drinking, which may include:  8 hours before the procedure - stop eating solid foods and drink clear liquids only.  2 hours before the procedure - stop drinking clear liquids. Medicines  Ask your health care provider about: ? Changing or stopping your regular medicines. This is especially important if you are taking diabetes medicines or blood thinners. ? Taking medicines such as aspirin and ibuprofen. These medicines can thin your blood. Do not take these medicines unless your health care provider tells you to take them. ? Taking over-the-counter medicines, vitamins, herbs, and supplements.  Medicine may be placed in your cervix the day before the procedure. This medicine causes the cervix to open (dilate). The larger opening makes it easier for the hysteroscope to be inserted into the uterus during the procedure. General instructions  Ask your health care provider: ? What steps will be taken to help prevent infection. These steps may include:  Washing skin with a germ-killing soap.  Taking antibiotic medicine.  Do not use any products that contain nicotine or tobacco for at least 4 weeks before the procedure. These products include cigarettes, chewing tobacco, and vaping devices, such as e-cigarettes. If you need help quitting, ask your health care provider.  Plan to have a responsible adult take you home from the hospital or clinic.  Plan to have a responsible adult care for you for the time you are told after you leave the hospital or clinic. This is important.  Empty your bladder before the procedure begins. What happens during the procedure?  An IV will be inserted into one of your veins.  You may be given: ?  A medicine to help you relax (sedative). ? A medicine that numbs the area around the cervix (local anesthetic). ? A medicine to make you fall asleep (general anesthetic).  A hysteroscope will be inserted through your vagina and into your uterus.  Air or fluid will  be used to enlarge your uterus to allow your health care provider to see it better. The amount of fluid used will be carefully checked throughout the procedure.  In some cases, tissue may be gently scraped from inside the uterus and sent to a lab for testing (biopsy). The procedure may vary among health care providers and hospitals. What can I expect after the procedure?  Your blood pressure, heart rate, breathing rate, and blood oxygen level will be monitored until you leave the hospital or clinic.  You may have cramps. You may be given medicines for this.  You may have bleeding, which may vary from light spotting to menstrual-like bleeding. This is normal.  If you had a biopsy, it is up to you to get the results. Ask your health care provider, or the department that is doing the procedure, when your results will be ready. Follow these instructions at home: Activity  Rest as told by your health care provider.  Return to your normal activities as told by your health care provider. Ask your health care provider what activities are safe for you.  If you were given a sedative during the procedure, it can affect you for several hours. Do not drive or operate machinery until your health care provider says that it is safe. Medicines  Do not take aspirin or other NSAIDs during recovery, as told by your healthcare provider. It can increase the risk of bleeding.  Ask your health care provider if the medicine prescribed to you: ? Requires you to avoid driving or using machinery. ? Can cause constipation. You may need to take these actions to prevent or treat constipation:  Drink enough fluid to keep your urine pale yellow.  Take over-the-counter or prescription medicines.  Eat foods that are high in fiber, such as beans, whole grains, and fresh fruits and vegetables.  Limit foods that are high in fat and processed sugars, such as fried or sweet foods. General instructions  Do not douche,  use tampons, or have sex for 2 weeks after the procedure, or until your health care provider approves.  Do not take baths, swim, or use a hot tub until your health care provider approves. Take showers instead of baths for 2 weeks, or for as long as told by your health care provider.  Keep all follow-up visits. This is important. Contact a health care provider if:  You feel dizzy or lightheaded.  You feel nauseous.  You have abnormal vaginal discharge.  You have a rash.  You have pain that does not get better with medicine.  You have chills. Get help right away if:  You have bleeding that is heavier than a normal menstrual period.  You have a fever.  You have pain or cramps that get worse.  You develop new abdominal pain.  You faint.  You have pain in your shoulder.  You are short of breath. Summary  Hysteroscopy is a procedure that is used to look inside a woman's womb (uterus).  After the procedure, you may have bleeding, which varies from light spotting to menstrual-like bleeding. This is normal. You may also have cramps.  Do not douche, use tampons, or have sex for 2 weeks after  the procedure, or until your health care provider approves.  Plan to have a responsible adult take you home from the hospital or clinic. This information is not intended to replace advice given to you by your health care provider. Make sure you discuss any questions you have with your health care provider. Document Revised: 12/18/2019 Document Reviewed: 12/18/2019 Elsevier Patient Education  2021 [employer].

## 2020-09-03 ENCOUNTER — Encounter: Payer: Self-pay | Admitting: Family Medicine

## 2020-09-03 NOTE — Telephone Encounter (Signed)
Voice mail from patient.  Agreeable to proceed on 5-18 but needs later start time.

## 2020-09-03 NOTE — Telephone Encounter (Signed)
Call to patient to follow-up on planned surgery date of 09-30-20. Left message to call back to [phone].

## 2020-09-03 NOTE — Telephone Encounter (Signed)
Return call to patient.  Advised surgery scheduled for 09-30-20 at 10 am at [facility], arrive by 8 am.  Advised letter, hospital brochure, and Covid testing instructions will be mailed.  Patient concerned about Covid test since she does not drive.  Advised of flexible drive up testing and can check with scheduling department.   Encounter closed.

## 2020-09-03 NOTE — Assessment & Plan Note (Addendum)
Will book for D & C with Hysteroscopic resection of both fibroid and polyp. This should help alleviate bleeding. Risks include but are not limited to bleeding, infection, injury to surrounding structures, including bowel, bladder and ureters, blood clots, and death.  Likelihood of success is high.

## 2020-09-11 ENCOUNTER — Other Ambulatory Visit: Payer: Self-pay | Admitting: Internal Medicine

## 2020-09-13 LAB — CBC WITH DIFFERENTIAL/PLATELET
Absolute Monocytes: 310 cells/uL (ref 200–950)
Basophils Absolute: 42 cells/uL (ref 0–200)
Basophils Relative: 1.3 %
Eosinophils Absolute: 90 cells/uL (ref 15–500)
Eosinophils Relative: 2.8 %
HCT: 41.4 % (ref 35.0–45.0)
Hemoglobin: 12.7 g/dL (ref 11.7–15.5)
Lymphs Abs: 1126 cells/uL (ref 850–3900)
MCH: 23.8 pg — ABNORMAL LOW (ref 27.0–33.0)
MCHC: 30.7 g/dL — ABNORMAL LOW (ref 32.0–36.0)
MCV: 77.7 fL — ABNORMAL LOW (ref 80.0–100.0)
MPV: 10.6 fL (ref 7.5–12.5)
Monocytes Relative: 9.7 %
Neutro Abs: 1632 cells/uL (ref 1500–7800)
Neutrophils Relative %: 51 %
Platelets: 358 10*3/uL (ref 140–400)
RBC: 5.33 10*6/uL — ABNORMAL HIGH (ref 3.80–5.10)
RDW: 19.5 % — ABNORMAL HIGH (ref 11.0–15.0)
Total Lymphocyte: 35.2 %
WBC: 3.2 10*3/uL — ABNORMAL LOW (ref 3.8–10.8)

## 2020-09-13 LAB — IRON, TOTAL/TOTAL IRON BINDING CAP
%SAT: 13 % (calc) — ABNORMAL LOW (ref 16–45)
Iron: 55 ug/dL (ref 40–190)
TIBC: 418 mcg/dL (calc) (ref 250–450)

## 2020-09-13 LAB — FERRITIN: Ferritin: 11 ng/mL — ABNORMAL LOW (ref 16–232)

## 2020-09-13 LAB — B12 AND FOLATE PANEL
Folate: 18.1 ng/mL
Vitamin B-12: 587 pg/mL (ref 200–1100)

## 2020-09-13 LAB — RETICULOCYTES
ABS Retic: 69290 cells/uL (ref 20000–80000)
Retic Ct Pct: 1.3 %

## 2020-09-13 LAB — SICKLE CELL SCREEN: Sickle Solubility Test - HGBRFX: NEGATIVE

## 2020-09-23 ENCOUNTER — Encounter (HOSPITAL_BASED_OUTPATIENT_CLINIC_OR_DEPARTMENT_OTHER): Payer: Self-pay | Admitting: Family Medicine

## 2020-09-28 ENCOUNTER — Other Ambulatory Visit (HOSPITAL_COMMUNITY): Payer: Medicare Other

## 2020-09-28 ENCOUNTER — Encounter (HOSPITAL_BASED_OUTPATIENT_CLINIC_OR_DEPARTMENT_OTHER)
Admission: RE | Admit: 2020-09-28 | Discharge: 2020-09-28 | Disposition: A | Discharge status: Home / Self Care | Payer: Medicare Other | Source: Ambulatory Visit | Attending: Family Medicine | Admitting: Family Medicine

## 2020-09-28 DIAGNOSIS — Z791 Long term (current) use of non-steroidal anti-inflammatories (NSAID): Secondary | ICD-10-CM | POA: Diagnosis not present

## 2020-09-28 DIAGNOSIS — Z79899 Other long term (current) drug therapy: Secondary | ICD-10-CM | POA: Diagnosis not present

## 2020-09-28 DIAGNOSIS — N939 Abnormal uterine and vaginal bleeding, unspecified: Secondary | ICD-10-CM | POA: Diagnosis present

## 2020-09-28 DIAGNOSIS — J45909 Unspecified asthma, uncomplicated: Secondary | ICD-10-CM | POA: Diagnosis not present

## 2020-09-28 DIAGNOSIS — Z01812 Encounter for preprocedural laboratory examination: Secondary | ICD-10-CM | POA: Diagnosis not present

## 2020-09-28 DIAGNOSIS — I10 Essential (primary) hypertension: Secondary | ICD-10-CM | POA: Diagnosis not present

## 2020-09-28 DIAGNOSIS — Z7951 Long term (current) use of inhaled steroids: Secondary | ICD-10-CM | POA: Diagnosis not present

## 2020-09-28 DIAGNOSIS — N840 Polyp of corpus uteri: Secondary | ICD-10-CM | POA: Diagnosis not present

## 2020-09-28 DIAGNOSIS — N920 Excessive and frequent menstruation with regular cycle: Secondary | ICD-10-CM | POA: Diagnosis not present

## 2020-09-28 DIAGNOSIS — Z793 Long term (current) use of hormonal contraceptives: Secondary | ICD-10-CM | POA: Diagnosis not present

## 2020-09-28 DIAGNOSIS — R569 Unspecified convulsions: Secondary | ICD-10-CM | POA: Diagnosis not present

## 2020-09-28 DIAGNOSIS — K219 Gastro-esophageal reflux disease without esophagitis: Secondary | ICD-10-CM | POA: Diagnosis not present

## 2020-09-28 LAB — BASIC METABOLIC PANEL
Anion gap: 8 (ref 5–15)
BUN: 10 mg/dL (ref 6–20)
CO2: 26 mmol/L (ref 22–32)
Calcium: 9.2 mg/dL (ref 8.9–10.3)
Chloride: 104 mmol/L (ref 98–111)
Creatinine, Ser: 0.87 mg/dL (ref 0.44–1.00)
GFR, Estimated: >60 mL/min (ref >60)
Glucose, Bld: 79 mg/dL (ref 70–99)
Potassium: 3.6 mmol/L (ref 3.5–5.1)
Sodium: 138 mmol/L (ref 135–145)

## 2020-09-28 LAB — CBC
HCT: 39.8 % (ref 36.0–46.0)
Hemoglobin: 12.8 g/dL (ref 12.0–15.0)
MCH: 24.8 pg — ABNORMAL LOW (ref 26.0–34.0)
MCHC: 32.2 g/dL (ref 30.0–36.0)
MCV: 77 fL — ABNORMAL LOW (ref 80.0–100.0)
Platelets: 358 10*3/uL (ref 150–400)
RBC: 5.17 MIL/uL — ABNORMAL HIGH (ref 3.87–5.11)
RDW: 18.1 % — ABNORMAL HIGH (ref 11.5–15.5)
WBC: 3.6 10*3/uL — ABNORMAL LOW (ref 4.0–10.5)
nRBC: 0 % (ref 0.0–0.2)

## 2020-09-28 LAB — POCT PREGNANCY, URINE: Preg Test, Ur: NEGATIVE

## 2020-09-29 NOTE — Anesthesia Preprocedure Evaluation (Addendum)
Anesthesia Evaluation  Patient identified by MRN, date of birth, ID band Patient awake    Reviewed: Allergy & Precautions, NPO status , Patient's Chart, lab work & pertinent test results  Airway Mallampati: II  TM Distance: >3 FB Neck ROM: Full    Dental  (+) Teeth Intact, Dental Advisory Given, Missing,    Pulmonary asthma (has been using inhaler daily w/ seasonal allergies) ,    Pulmonary exam normal breath sounds clear to auscultation       Cardiovascular hypertension, Pt. on medications Normal cardiovascular exam Rhythm:Regular Rate:Normal     Neuro/Psych Seizures -, Well Controlled,  Last seizure 4 years ago negative psych ROS   GI/Hepatic Neg liver ROS, GERD  Medicated and Controlled,  Endo/Other  Obesity BMI 32  Renal/GU negative Renal ROS  Female GU complaint (menorrhagia, anemia, endometrial polyp, submucosal fibroid )     Musculoskeletal  (+) Fibromyalgia -  Abdominal   Peds  Hematology negative hematology ROS (+)   Anesthesia Other Findings   Reproductive/Obstetrics                           Anesthesia Physical Anesthesia Plan  ASA: II  Anesthesia Plan: General   Post-op Pain Management:    Induction: Intravenous  PONV Risk Score and Plan: 4 or greater and Ondansetron, Dexamethasone, Midazolam, Scopolamine patch - Pre-op and Treatment may vary due to age or medical condition  Airway Management Planned: LMA  Additional Equipment: None  Intra-op Plan:   Post-operative Plan: Extubation in OR  Informed Consent: I have reviewed the patients History and Physical, chart, labs and discussed the procedure including the risks, benefits and alternatives for the proposed anesthesia with the patient or authorized representative who has indicated his/her understanding and acceptance.     Dental advisory given  Plan Discussed with: CRNA  Anesthesia Plan Comments: (Pt with  likely vasovagal episode upon IV insertion in preop, but per nursing staff also had some brief twitching. Upon questioning patient, skipped her PM dose of keppra last night as well as her morning dose today. Will get 1g IV keppra from pharmacy and infuse prior to OR.)       Anesthesia Quick Evaluation

## 2020-09-30 ENCOUNTER — Encounter (HOSPITAL_BASED_OUTPATIENT_CLINIC_OR_DEPARTMENT_OTHER): Payer: Self-pay | Admitting: Family Medicine

## 2020-09-30 ENCOUNTER — Encounter (HOSPITAL_BASED_OUTPATIENT_CLINIC_OR_DEPARTMENT_OTHER)
Admission: RE | Disposition: A | Discharge status: Home / Self Care | Payer: Self-pay | Source: Ambulatory Visit | Attending: Family Medicine

## 2020-09-30 ENCOUNTER — Other Ambulatory Visit: Payer: Self-pay

## 2020-09-30 ENCOUNTER — Ambulatory Visit (HOSPITAL_BASED_OUTPATIENT_CLINIC_OR_DEPARTMENT_OTHER): Payer: Medicare Other | Admitting: Anesthesiology

## 2020-09-30 ENCOUNTER — Ambulatory Visit (HOSPITAL_BASED_OUTPATIENT_CLINIC_OR_DEPARTMENT_OTHER)
Admission: RE | Admit: 2020-09-30 | Discharge: 2020-09-30 | Disposition: A | Discharge status: Home / Self Care | Payer: Medicare Other | Source: Ambulatory Visit | Attending: Family Medicine | Admitting: Family Medicine

## 2020-09-30 ENCOUNTER — Other Ambulatory Visit (HOSPITAL_COMMUNITY)
Admission: RE | Admit: 2020-09-30 | Discharge: 2020-09-30 | Disposition: A | Discharge status: Home / Self Care | Payer: Medicare Other | Source: Ambulatory Visit | Attending: Family Medicine | Admitting: Family Medicine

## 2020-09-30 DIAGNOSIS — J45909 Unspecified asthma, uncomplicated: Secondary | ICD-10-CM | POA: Insufficient documentation

## 2020-09-30 DIAGNOSIS — Z124 Encounter for screening for malignant neoplasm of cervix: Secondary | ICD-10-CM | POA: Diagnosis not present

## 2020-09-30 DIAGNOSIS — N920 Excessive and frequent menstruation with regular cycle: Secondary | ICD-10-CM | POA: Insufficient documentation

## 2020-09-30 DIAGNOSIS — K219 Gastro-esophageal reflux disease without esophagitis: Secondary | ICD-10-CM | POA: Diagnosis not present

## 2020-09-30 DIAGNOSIS — Z7951 Long term (current) use of inhaled steroids: Secondary | ICD-10-CM | POA: Insufficient documentation

## 2020-09-30 DIAGNOSIS — R569 Unspecified convulsions: Secondary | ICD-10-CM | POA: Insufficient documentation

## 2020-09-30 DIAGNOSIS — Z793 Long term (current) use of hormonal contraceptives: Secondary | ICD-10-CM | POA: Insufficient documentation

## 2020-09-30 DIAGNOSIS — N840 Polyp of corpus uteri: Secondary | ICD-10-CM | POA: Insufficient documentation

## 2020-09-30 DIAGNOSIS — Z79899 Other long term (current) drug therapy: Secondary | ICD-10-CM | POA: Insufficient documentation

## 2020-09-30 DIAGNOSIS — I10 Essential (primary) hypertension: Secondary | ICD-10-CM | POA: Insufficient documentation

## 2020-09-30 DIAGNOSIS — Z791 Long term (current) use of non-steroidal anti-inflammatories (NSAID): Secondary | ICD-10-CM | POA: Insufficient documentation

## 2020-09-30 HISTORY — DX: Gastro-esophageal reflux disease without esophagitis: K21.9

## 2020-09-30 HISTORY — PX: DILATATION & CURETTAGE/HYSTEROSCOPY WITH MYOSURE: SHX6511

## 2020-09-30 LAB — POCT PREGNANCY, URINE: Preg Test, Ur: NEGATIVE

## 2020-09-30 SURGERY — DILATATION & CURETTAGE/HYSTEROSCOPY WITH MYOSURE
Anesthesia: General | Site: Vagina

## 2020-09-30 MED ORDER — FENTANYL CITRATE (PF) 100 MCG/2ML IJ SOLN
INTRAMUSCULAR | Status: DC | PRN
  Administered 2020-09-30: 50 ug via INTRAVENOUS

## 2020-09-30 MED ORDER — LACTATED RINGERS IV SOLN: INTRAVENOUS | Status: DC

## 2020-09-30 MED ORDER — MIDAZOLAM HCL 2 MG/2ML IJ SOLN
INTRAMUSCULAR | Status: AC
  Filled 2020-09-30: qty 2

## 2020-09-30 MED ORDER — DEXAMETHASONE SODIUM PHOSPHATE 4 MG/ML IJ SOLN
INTRAMUSCULAR | Status: DC | PRN
  Administered 2020-09-30: 5 mg via INTRAVENOUS

## 2020-09-30 MED ORDER — HYDROMORPHONE HCL 1 MG/ML IJ SOLN: 0.2500 mg | INTRAMUSCULAR | Status: DC | PRN

## 2020-09-30 MED ORDER — LIDOCAINE 2% (20 MG/ML) 5 ML SYRINGE
INTRAMUSCULAR | Status: AC
  Filled 2020-09-30: qty 5

## 2020-09-30 MED ORDER — LEVETIRACETAM IN NACL 1000 MG/100ML IV SOLN
1000.0000 mg | Freq: Once | INTRAVENOUS | Status: AC
  Administered 2020-09-30: 1000 mg via INTRAVENOUS
  Filled 2020-09-30: qty 100

## 2020-09-30 MED ORDER — MEPERIDINE HCL 25 MG/ML IJ SOLN: 6.2500 mg | INTRAMUSCULAR | Status: DC | PRN

## 2020-09-30 MED ORDER — PROPOFOL 10 MG/ML IV BOLUS
INTRAVENOUS | Status: AC
  Filled 2020-09-30: qty 20

## 2020-09-30 MED ORDER — OXYCODONE HCL 5 MG/5ML PO SOLN: 5.0000 mg | Freq: Once | ORAL | Status: DC | PRN

## 2020-09-30 MED ORDER — CELECOXIB 200 MG PO CAPS
400.0000 mg | ORAL_CAPSULE | ORAL | Status: AC
  Administered 2020-09-30: 400 mg via ORAL

## 2020-09-30 MED ORDER — POVIDONE-IODINE 10 % EX SWAB: 2.0000 "application " | Freq: Once | CUTANEOUS | Status: DC

## 2020-09-30 MED ORDER — PROMETHAZINE HCL 25 MG/ML IJ SOLN: 6.2500 mg | INTRAMUSCULAR | Status: DC | PRN

## 2020-09-30 MED ORDER — GABAPENTIN 300 MG PO CAPS
300.0000 mg | ORAL_CAPSULE | ORAL | Status: AC
  Administered 2020-09-30: 300 mg via ORAL

## 2020-09-30 MED ORDER — PROPOFOL 10 MG/ML IV BOLUS
INTRAVENOUS | Status: DC | PRN
  Administered 2020-09-30: 200 mg via INTRAVENOUS

## 2020-09-30 MED ORDER — SCOPOLAMINE 1 MG/3DAYS TD PT72
MEDICATED_PATCH | TRANSDERMAL | Status: AC
  Filled 2020-09-30: qty 1

## 2020-09-30 MED ORDER — SCOPOLAMINE 1 MG/3DAYS TD PT72
1.0000 | MEDICATED_PATCH | TRANSDERMAL | Status: DC
  Administered 2020-09-30: 1.5 mg via TRANSDERMAL

## 2020-09-30 MED ORDER — LIDOCAINE-EPINEPHRINE 1 %-1:100000 IJ SOLN
INTRAMUSCULAR | Status: AC
  Filled 2020-09-30: qty 1

## 2020-09-30 MED ORDER — BUPIVACAINE HCL (PF) 0.25 % IJ SOLN
INTRAMUSCULAR | Status: AC
  Filled 2020-09-30: qty 30

## 2020-09-30 MED ORDER — ONDANSETRON HCL 4 MG/2ML IJ SOLN
INTRAMUSCULAR | Status: AC
  Filled 2020-09-30: qty 2

## 2020-09-30 MED ORDER — ONDANSETRON HCL 4 MG/2ML IJ SOLN
INTRAMUSCULAR | Status: DC | PRN
  Administered 2020-09-30: 4 mg via INTRAVENOUS

## 2020-09-30 MED ORDER — HEPARIN SOD (PORK) LOCK FLUSH 100 UNIT/ML IV SOLN
INTRAVENOUS | Status: AC
  Filled 2020-09-30: qty 5

## 2020-09-30 MED ORDER — EPHEDRINE 5 MG/ML INJ
INTRAVENOUS | Status: AC
  Filled 2020-09-30: qty 10

## 2020-09-30 MED ORDER — OXYCODONE HCL 5 MG PO TABS: 5.0000 mg | ORAL_TABLET | Freq: Once | ORAL | Status: DC | PRN

## 2020-09-30 MED ORDER — LIDOCAINE-EPINEPHRINE 1 %-1:100000 IJ SOLN
INTRAMUSCULAR | Status: DC | PRN
  Administered 2020-09-30: 20 mL

## 2020-09-30 MED ORDER — ACETAMINOPHEN 500 MG PO TABS
ORAL_TABLET | ORAL | Status: AC
  Filled 2020-09-30: qty 2

## 2020-09-30 MED ORDER — LIDOCAINE HCL (CARDIAC) PF 100 MG/5ML IV SOSY
PREFILLED_SYRINGE | INTRAVENOUS | Status: DC | PRN
  Administered 2020-09-30: 60 mg via INTRAVENOUS

## 2020-09-30 MED ORDER — GABAPENTIN 300 MG PO CAPS
ORAL_CAPSULE | ORAL | Status: AC
  Filled 2020-09-30: qty 1

## 2020-09-30 MED ORDER — FENTANYL CITRATE (PF) 100 MCG/2ML IJ SOLN
INTRAMUSCULAR | Status: AC
  Filled 2020-09-30: qty 2

## 2020-09-30 MED ORDER — KETOROLAC TROMETHAMINE 30 MG/ML IJ SOLN
INTRAMUSCULAR | Status: AC
  Filled 2020-09-30: qty 1

## 2020-09-30 MED ORDER — MIDAZOLAM HCL 5 MG/5ML IJ SOLN
INTRAMUSCULAR | Status: DC | PRN
  Administered 2020-09-30: 2 mg via INTRAVENOUS

## 2020-09-30 MED ORDER — KETOROLAC TROMETHAMINE 30 MG/ML IJ SOLN: 30.0000 mg | Freq: Once | INTRAMUSCULAR | Status: DC | PRN

## 2020-09-30 MED ORDER — ACETAMINOPHEN 500 MG PO TABS
1000.0000 mg | ORAL_TABLET | ORAL | Status: AC
  Administered 2020-09-30: 1000 mg via ORAL

## 2020-09-30 MED ORDER — PHENYLEPHRINE 40 MCG/ML (10ML) SYRINGE FOR IV PUSH (FOR BLOOD PRESSURE SUPPORT)
PREFILLED_SYRINGE | INTRAVENOUS | Status: AC
  Filled 2020-09-30: qty 10

## 2020-09-30 MED ORDER — CELECOXIB 200 MG PO CAPS
ORAL_CAPSULE | ORAL | Status: AC
  Filled 2020-09-30: qty 2

## 2020-09-30 MED ORDER — HEPARIN (PORCINE) IN NACL 1000-0.9 UT/500ML-% IV SOLN
INTRAVENOUS | Status: AC
  Filled 2020-09-30: qty 500

## 2020-09-30 MED ORDER — EPHEDRINE SULFATE 50 MG/ML IJ SOLN
INTRAMUSCULAR | Status: DC | PRN
  Administered 2020-09-30: 5 mg via INTRAVENOUS

## 2020-09-30 SURGICAL SUPPLY — 16 items
CATH ROBINSON RED A/P 16FR (CATHETERS) ×2 IMPLANT
DEVICE MYOSURE LITE (MISCELLANEOUS) IMPLANT
DEVICE MYOSURE REACH (MISCELLANEOUS) IMPLANT
GAUZE 4X4 16PLY RFD (DISPOSABLE) ×2 IMPLANT
GLOVE SURG LTX SZ7 (GLOVE) ×2 IMPLANT
GLOVE SURG UNDER POLY LF SZ7 (GLOVE) ×4 IMPLANT
GOWN STRL REUS W/TWL LRG LVL3 (GOWN DISPOSABLE) ×4 IMPLANT
KIT PROCEDURE FLUENT (KITS) ×2 IMPLANT
MYOSURE XL FIBROID (MISCELLANEOUS) ×2
PACK VAGINAL MINOR WOMEN LF (CUSTOM PROCEDURE TRAY) ×2 IMPLANT
PAD OB MATERNITY 4.3X12.25 (PERSONAL CARE ITEMS) ×2 IMPLANT
PAD PREP 24X48 CUFFED NSTRL (MISCELLANEOUS) ×2 IMPLANT
SEAL ROD LENS SCOPE MYOSURE (ABLATOR) ×2 IMPLANT
SLEEVE SCD COMPRESS KNEE MED (STOCKING) ×2 IMPLANT
SYSTEM TISS REMOVAL MYOSURE XL (MISCELLANEOUS) IMPLANT
TOWEL GREEN STERILE FF (TOWEL DISPOSABLE) ×4 IMPLANT

## 2020-09-30 NOTE — Progress Notes (Signed)
0905 patient had one episode of feeling dizzy and nauseated. Eye twitched, but was able to respond to questions. Dr. Doroteo Glassman at bedside. Vital signs stable.  IVF opened. Placed in reclined position.  Orders given from Dr. Doroteo Glassman for IV keppra and patient moved to a a stretcher.

## 2020-09-30 NOTE — Op Note (Addendum)
Preoperative diagnoses:  Abnormal uterine bleeding, screen for cervical cancer  Postoperative diagnosis: Same, prolapsed endometrial polyp  Procedure: Pap smear, D & C, hysteroscopy, Myosure resection of polyp and fibroid  Surgeon: Standley Dakins. Kennon Rounds, MD  Anesthesia: LMA Pervis Hocking, DO, paracervical block  Findings:  Prolapsed endometrial polyp 1.75 cm, endometrial polyp stalk on left and small submucosal fibroid on right and posterior fundus  Fluid deficit: 185 mL  Estimated blood loss: Minimum  Pathology: Endometrial curettings  Indications for procedure: 50 y.o. [id number] with history of bleeding who presents for definitive treatment of her bleeding. Needs pap smear.  Procedure: The patient was taken to the operating room where spinal analgesia was inserted. SCDs were in place.  Time out was performed. Patient was placed in dorsolithotomy in [place]. A pap smear obtained. Prolapsed polyp noted. She was prepped and draped in the usual sterile fashion. A Red Rubber catheter was used to drain her bladder. A speculum was placeed in the vagina. The cervix was visualized anteriorly and grasped with a single-tooth tenaculum. Paracervical block was performed with 1% lidocaine with epinephrine with 20 cc injected. The prolapsing fibroid grasped with ring forcep and twisted until it came off.  The uterus sounded to 7 cm. No dilation needed. The hysteroscope was inserted and the endometrial cavity and inspected. There were above findings noted in the endometrial cavity with both ostia seen. The Myosure device used to remove polyp stalk on left and small fibroid on right and fundal abnormality. The hysteroscope was removed.  All instruments were removed from the vagina. All instrument, needle and lap counts were correct x2. The patient was awakened and is recovering in stable condition.  [name] MD 09/30/2020 10:43 AM

## 2020-09-30 NOTE — Transfer of Care (Signed)
Immediate Anesthesia Transfer of Care Note  Patient: Isabel Shaw  Procedure(s) Performed: DILATATION & CURETTAGE/HYSTEROSCOPY WITH MYOSURE (N/A Vagina )  Patient Location: PACU  Anesthesia Type:General  Level of Consciousness: awake, alert  and oriented  Airway & Oxygen Therapy: Patient Spontanous Breathing and Patient connected to nasal cannula oxygen  Post-op Assessment: Report given to RN and Post -op Vital signs reviewed and stable  Post vital signs: Reviewed and stable  Last Vitals:  Vitals Value Taken Time  BP 96/68 09/30/20 1100  Temp    Pulse 99 09/30/20 1101  Resp 31 09/30/20 1101  SpO2 99 % 09/30/20 1101  Vitals shown include unvalidated device data.  Last Pain:  Vitals:   09/30/20 0855  TempSrc: Oral  PainSc: 0-No pain      Patients Stated Pain Goal: 3 (32/44/01 0272)  Complications: No complications documented.

## 2020-09-30 NOTE — Anesthesia Postprocedure Evaluation (Signed)
Anesthesia Post Note  Patient: Isabel Shaw  Procedure(s) Performed: DILATATION & CURETTAGE/HYSTEROSCOPY WITH MYOSURE (N/A Vagina )     Patient location during evaluation: PACU Anesthesia Type: General Level of consciousness: awake and alert, oriented and patient cooperative Pain management: pain level controlled Vital Signs Assessment: post-procedure vital signs reviewed and stable Respiratory status: spontaneous breathing, nonlabored ventilation and respiratory function stable Cardiovascular status: blood pressure returned to baseline and stable Postop Assessment: no apparent nausea or vomiting Anesthetic complications: no   No complications documented.  Last Vitals:  Vitals:   09/30/20 1100 09/30/20 1115  BP: 96/68 124/82  Pulse: 95 86  Resp: (!) 33 18  Temp:    SpO2: 99% 99%    Last Pain:  Vitals:   09/30/20 1115  TempSrc:   PainSc: 0-No pain                 Pervis Hocking

## 2020-09-30 NOTE — Discharge Instructions (Signed)
Hysteroscopy Hysteroscopy is a procedure used to look inside a woman's womb (uterus). This may be done for various reasons, including:  To look for tumors and other growths in the uterus.  To evaluate abnormal bleeding, fibroid tumors, polyps, scar tissue, or uterine cancer.  To determine why a woman is unable to get pregnant or has had repeated pregnancy losses.  To locate an IUD (intrauterine device).  To place a birth control device into the fallopian tubes. During this procedure, a thin, flexible tube with a small light and camera (hysteroscope) is used to examine the uterus. The camera sends images to a monitor in the room so that your health care provider can view the inside of your uterus. A hysteroscopy should be done right after a menstrual period. Tell a health care provider about:  Any allergies you have.  All medicines you are taking, including vitamins, herbs, eye drops, creams, and over-the-counter medicines.  Any problems you or family members have had with anesthetic medicines.  Any blood disorders you have.  Any surgeries you have had.  Any medical conditions you have.  Whether you are pregnant or may be pregnant.  Whether you have been diagnosed with an STI (sexually transmitted infection) or you think you have an STI. What are the risks? Generally, this is a safe procedure. However, problems may occur, including:  Excessive bleeding.  Infection.  Damage to the uterus or other structures or organs.  Allergic reaction to medicines or fluids that are used in the procedure. What happens during the procedure?  An IV will be inserted into one of your veins.  You may be given: ? A medicine to help you relax (sedative). ? A medicine that numbs the area around the cervix (local anesthetic). ? A medicine to make you fall asleep (general anesthetic).  A hysteroscope will be inserted through your vagina and into your uterus.  Air or fluid will be used to  enlarge your uterus to allow your health care provider to see it better. The amount of fluid used will be carefully checked throughout the procedure.  In some cases, tissue may be gently scraped from inside the uterus and sent to a lab for testing (biopsy). The procedure may vary among health care providers and hospitals. What can I expect after the procedure?  Your blood pressure, heart rate, breathing rate, and blood oxygen level will be monitored until you leave the hospital or clinic.  You may have cramps. You may be given medicines for this.  You may have bleeding, which may vary from light spotting to menstrual-like bleeding. This is normal.  If you had a biopsy, it is up to you to get the results. Ask your health care provider, or the department that is doing the procedure, when your results will be ready. Follow these instructions at home: Activity  Rest as told by your health care provider.  Return to your normal activities as told by your health care provider. Ask your health care provider what activities are safe for you.  If you were given a sedative during the procedure, it can affect you for several hours. Do not drive or operate machinery until your health care provider says that it is safe. Medicines  Do not take aspirin or other NSAIDs during recovery, as told by your healthcare provider. It can increase the risk of bleeding.  Ask your health care provider if the medicine prescribed to you: ? Requires you to avoid driving or using machinery. ? Can  cause constipation. You may need to take these actions to prevent or treat constipation:  Drink enough fluid to keep your urine pale yellow.  Take over-the-counter or prescription medicines.  Eat foods that are high in fiber, such as beans, whole grains, and fresh fruits and vegetables.  Limit foods that are high in fat and processed sugars, such as fried or sweet foods. General instructions  Do not douche, use  tampons, or have sex for 2 weeks after the procedure, or until your health care provider approves.  Do not take baths, swim, or use a hot tub until your health care provider approves. Take showers instead of baths for 2 weeks, or for as long as told by your health care provider.  Keep all follow-up visits. This is important. Contact a health care provider if:  You feel dizzy or lightheaded.  You feel nauseous.  You have abnormal vaginal discharge.  You have a rash.  You have pain that does not get better with medicine.  You have chills. Get help right away if:  You have bleeding that is heavier than a normal menstrual period.  You have a fever.  You have pain or cramps that get worse.  You develop new abdominal pain.  You faint.  You have pain in your shoulder.  You are short of breath. Summary  Hysteroscopy is a procedure that is used to look inside a woman's womb (uterus).  After the procedure, you may have bleeding, which varies from light spotting to menstrual-like bleeding. This is normal. You may also have cramps.  Do not douche, use tampons, or have sex for 2 weeks after the procedure, or until your health care provider approves.  Plan to have a responsible adult take you home from the hospital or clinic. This information is not intended to replace advice given to you by your health care provider. Make sure you discuss any questions you have with your health care provider. Document Revised: 12/18/2019 Document Reviewed: 12/18/2019 Elsevier Patient Education  2021 [place].  No tylenol until after 3:17pm today. No Ibuprofen/motrin until after 5:38pm.   Post Anesthesia Home Care Instructions  Activity: Get plenty of rest for the remainder of the day. A responsible individual must stay with you for 24 hours following the procedure.  For the next 24 hours, DO NOT: -Drive a car -Paediatric nurse -Drink alcoholic beverages -Take any medication unless  instructed by your physician -Make any legal decisions or sign important papers.  Meals: Start with liquid foods such as gelatin or soup. Progress to regular foods as tolerated. Avoid greasy, spicy, heavy foods. If nausea and/or vomiting occur, drink only clear liquids until the nausea and/or vomiting subsides. Call your physician if vomiting continues.  Special Instructions/Symptoms: Your throat may feel dry or sore from the anesthesia or the breathing tube placed in your throat during surgery. If this causes discomfort, gargle with warm salt water. The discomfort should disappear within 24 hours.  If you had a scopolamine patch placed behind your ear for the management of post- operative nausea and/or vomiting:  1. The medication in the patch is effective for 72 hours, after which it should be removed.  Wrap patch in a tissue and discard in the trash. Wash hands thoroughly with soap and water. 2. You may remove the patch earlier than 72 hours if you experience unpleasant side effects which may include dry mouth, dizziness or visual disturbances. 3. Avoid touching the patch. Wash your hands with soap and water  after contact with the patch.

## 2020-09-30 NOTE — H&P (Signed)
Isabel Shaw is an 50 y.o. [phone] female.   Chief Complaint: abnormal bleeding HPI: Here with bleeding. U/s appears to show polyp and fibroid. Also needs pap smear.  Past Medical History:  Diagnosis Date  . Asthma   . Fibromyalgia   . GERD (gastroesophageal reflux disease)   . Hypertension   . Seizures ([place])   . Uterine fibroid     Past Surgical History:  Procedure Laterality Date  . BUNIONECTOMY Right 12/25/2015  . Heel spur resect w/Plantar Fasciotomy Right 09-22-03   Right foot  . NEURECTOMY FOOT Right 01/26/04   Right foot, Plantar calc.  Marland Kitchen Neuroplasty digital n. Right 07/02/07   Right x 3  . TARSAL TUNNEL RELEASE Right 01/26/04   Right foot  . TARSAL TUNNEL RELEASE Right 07/02/07   Right foot    History reviewed. No pertinent family history. Social History:  reports that she has never smoked. She has never used smokeless tobacco. She reports that she does not drink alcohol and does not use drugs.  Allergies: No Known Allergies  Medications Prior to Admission  Medication Sig Dispense Refill  . albuterol (PROVENTIL HFA;VENTOLIN HFA) 108 (90 Base) MCG/ACT inhaler Inhale 2 puffs into the lungs every 4 (four) hours as needed for wheezing or shortness of breath. 1 Inhaler 0  . albuterol (PROVENTIL) (2.5 MG/3ML) 0.083% nebulizer solution Take 3 mLs (2.5 mg total) by nebulization every 6 (six) hours as needed for wheezing or shortness of breath. 75 mL 0  . amitriptyline (ELAVIL) 50 MG tablet Take 50 mg by mouth at bedtime.    . FEROSUL 325 (65 Fe) MG tablet Take 325 mg by mouth 2 (two) times daily.    . Fluticasone-Salmeterol (ADVAIR) 250-50 MCG/DOSE AEPB Inhale 1 puff into the lungs every 12 (twelve) hours.    . gabapentin (NEURONTIN) 300 MG capsule Take 600 mg by mouth 2 (two) times daily.    . hydrochlorothiazide (HYDRODIURIL) 12.5 MG tablet Take 12.5 mg by mouth daily.    Marland Kitchen ibuprofen (ADVIL,MOTRIN) 800 MG tablet Take 800 mg by mouth every 8 (eight) hours as needed.    Marland Kitchen  ipratropium (ATROVENT) 0.06 % nasal spray Place 2 sprays into both nostrils 4 (four) times daily. 15 mL 0  . levETIRAcetam (KEPPRA) 500 MG tablet Take 500 mg by mouth 2 (two) times daily.    Marland Kitchen loratadine (CLARITIN) 10 MG tablet Take 10 mg by mouth at bedtime.    . norethindrone (AYGESTIN) 5 MG tablet Take 2 tablets (10 mg total) by mouth daily. 60 tablet 3  . omeprazole (PRILOSEC) 20 MG capsule Take 20 mg by mouth 2 (two) times daily.    . SYMBICORT 160-4.5 MCG/ACT inhaler SMARTSIG:2 Puff(s) By Mouth Twice Daily    . tiZANidine (ZANAFLEX) 4 MG tablet Take 4 mg by mouth daily as needed.    . triamcinolone ointment (KENALOG) 0.1 % APPLY EXTERNALLY TO THE AFFECTED AREA TWICE DAILY AS NEEDED    . Vitamin D, Ergocalciferol, (DRISDOL) 1.25 MG (50000 UNIT) CAPS capsule Take 50,000 Units by mouth once a week.      A comprehensive review of systems was negative.  Blood pressure 111/76, pulse 65, temperature 98.3 F (36.8 C), temperature source Oral, resp. rate 16, height 4\' 11"  (1.499 m), weight 67.6 kg, last menstrual period 08/30/2020, SpO2 95 %. General appearance: alert, cooperative and appears stated age Head: Normocephalic, without obvious abnormality, atraumatic Neck: supple, symmetrical, trachea midline Lungs: normal effort Heart: regular rate and rhythm Abdomen: soft, non-tender;  bowel sounds normal; no masses,  no organomegaly Extremities: extremities normal, atraumatic, no cyanosis or edema Skin: Skin color, texture, turgor normal. No rashes or lesions Neurologic: Grossly normal   Lab Results  Component Value Date   WBC 3.6 (L) 09/28/2020   HGB 12.8 09/28/2020   HCT 39.8 09/28/2020   MCV 77.0 (L) 09/28/2020   PLT 358 09/28/2020   Lab Results  Component Value Date   PREGTESTUR NEGATIVE 09/30/2020     Assessment/Plan Principal Problem:   Menorrhagia with regular cycle  For Pap smear D & C with hysteroscopy and Myosure resection of polyp and fibroid. Patient had vagal  response to IV start and blood draw. She has h/o seizure d/o and is receiving Keppra per anesthesia. Risks include but are not limited to bleeding, infection, injury to surrounding structures, including bowel, bladder and ureters, blood clots, and death.  Likelihood of success is high.    Isabel Shaw 09/30/2020, 9:45 AM

## 2020-09-30 NOTE — Anesthesia Procedure Notes (Signed)
Procedure Name: LMA Insertion Date/Time: 09/30/2020 10:15 AM Performed by: Bufford Spikes, CRNA Pre-anesthesia Checklist: Patient identified, Emergency Drugs available, Suction available and Patient being monitored Patient Re-evaluated:Patient Re-evaluated prior to induction Oxygen Delivery Method: Circle system utilized Preoxygenation: Pre-oxygenation with 100% oxygen Induction Type: IV induction Ventilation: Mask ventilation without difficulty LMA: LMA inserted LMA Size: 3.0 Number of attempts: 1 Placement Confirmation: positive ETCO2 Tube secured with: Tape Dental Injury: Teeth and Oropharynx as per pre-operative assessment

## 2020-10-02 ENCOUNTER — Encounter (HOSPITAL_BASED_OUTPATIENT_CLINIC_OR_DEPARTMENT_OTHER): Payer: Self-pay | Admitting: Family Medicine

## 2020-10-02 LAB — CYTOLOGY - PAP
Comment: NEGATIVE
Diagnosis: NEGATIVE
Diagnosis: REACTIVE
High risk HPV: NEGATIVE

## 2020-10-02 LAB — SURGICAL PATHOLOGY

## 2020-10-21 ENCOUNTER — Encounter: Payer: Self-pay | Admitting: Family Medicine

## 2020-10-21 ENCOUNTER — Other Ambulatory Visit: Payer: Self-pay

## 2020-10-21 ENCOUNTER — Ambulatory Visit (INDEPENDENT_AMBULATORY_CARE_PROVIDER_SITE_OTHER): Payer: Medicare Other | Admitting: Family Medicine

## 2020-10-21 VITALS — BP 124/87 | HR 65 | Ht 59.0 in | Wt 151.3 lb

## 2020-10-21 DIAGNOSIS — Z09 Encounter for follow-up examination after completed treatment for conditions other than malignant neoplasm: Secondary | ICD-10-CM

## 2020-10-21 DIAGNOSIS — N920 Excessive and frequent menstruation with regular cycle: Secondary | ICD-10-CM

## 2020-10-21 DIAGNOSIS — D219 Benign neoplasm of connective and other soft tissue, unspecified: Secondary | ICD-10-CM

## 2020-10-21 NOTE — Assessment & Plan Note (Signed)
Given removal of polyp and fibroid, and no bleeding, may stop her Aygestin.

## 2020-10-21 NOTE — Assessment & Plan Note (Signed)
Removed and pathology confirms--no more bleeding

## 2020-10-21 NOTE — Progress Notes (Signed)
   Subjective:    Patient ID: Isabel Shaw is a 50 y.o. female presenting with Follow-up  on 10/21/2020  HPI: Here for f/u following D & C with hysteroscopic resection of polyp and submucosal fibroid. She has no complaints. She is not bleeding. Remains on Aygestin.  Review of Systems  Constitutional: Negative for chills and fever.  Respiratory: Negative for shortness of breath.   Cardiovascular: Negative for chest pain.  Gastrointestinal: Negative for abdominal pain, nausea and vomiting.  Genitourinary: Negative for dysuria.  Skin: Negative for rash.      Objective:    BP 124/87   Pulse 65   Ht 4\' 11"  (1.499 m)   Wt 151 lb 4.8 oz (68.6 kg)   LMP 08/30/2020   BMI 30.56 kg/m  Physical Exam Constitutional:      General: She is not in acute distress.    Appearance: She is well-developed.  HENT:     Head: Normocephalic and atraumatic.  Eyes:     General: No scleral icterus. Cardiovascular:     Rate and Rhythm: Normal rate.  Pulmonary:     Effort: Pulmonary effort is normal.  Abdominal:     Palpations: Abdomen is soft.  Musculoskeletal:     Cervical back: Neck supple.  Skin:    General: Skin is warm and dry.  Neurological:     Mental Status: She is alert and oriented to person, place, and time.         Assessment & Plan:   Problem List Items Addressed This Visit      Unprioritized   Fibroid - Primary    Removed and pathology confirms--no more bleeding      Menorrhagia with regular cycle    Given removal of polyp and fibroid, and no bleeding, may stop her Aygestin.       Other Visit Diagnoses    Postop check       Doing well. Procedure and pathology reviewed including pap smear.       Return if symptoms worsen or fail to improve.  [name] 10/21/2020 2:40 PM

## 2020-11-02 ENCOUNTER — Telehealth: Payer: Medicare Other | Admitting: Family Medicine

## 2020-12-15 ENCOUNTER — Other Ambulatory Visit: Payer: Self-pay | Admitting: Family Medicine

## 2020-12-15 DIAGNOSIS — N920 Excessive and frequent menstruation with regular cycle: Secondary | ICD-10-CM

## 2021-02-27 ENCOUNTER — Ambulatory Visit (HOSPITAL_COMMUNITY)
Admission: EM | Admit: 2021-02-27 | Discharge: 2021-02-27 | Disposition: A | Discharge status: Home / Self Care | Payer: Medicare Other | Attending: Internal Medicine | Admitting: Internal Medicine

## 2021-02-27 ENCOUNTER — Other Ambulatory Visit: Payer: Self-pay

## 2021-02-27 DIAGNOSIS — J029 Acute pharyngitis, unspecified: Secondary | ICD-10-CM | POA: Diagnosis not present

## 2021-02-27 NOTE — ED Triage Notes (Signed)
Pt reports cough and sore throat started 2 days ago.

## 2021-02-27 NOTE — Discharge Instructions (Signed)
Increase oral fluid intake Warm salt water gargle Start Chloraseptic throat spray Return to urgent care if symptoms worsen.

## 2021-02-27 NOTE — ED Provider Notes (Signed)
[place]    CSN: [id number] Arrival date & time: 02/27/21  1212      History   Chief Complaint Chief Complaint  Patient presents with   Sore Throat   Cough    HPI Isabel Shaw is a 50 y.o. female comes to the urgent care with 2 days history of sore throat, difficulty swallowing and a nonproductive cough.  Patient denies any fever or chills.  No shortness of breath or wheezing.  Patient has a history of asthma likely mild intermittent controlled with rescue inhaler use.  No fatigue or body aches.  Patient's grandchild has similar symptoms.  Patient is fully vaccinated against COVID-19 virus. HPI  Past Medical History:  Diagnosis Date   Asthma    Fibromyalgia    GERD (gastroesophageal reflux disease)    Hypertension    Seizures ([place])    Uterine fibroid     Patient Active Problem List   Diagnosis Date Noted   Iron deficiency anemia due to chronic blood loss 08/03/2020   Menorrhagia with regular cycle 08/03/2020   Possible exposure to STD 11/28/2019   Fibromyalgia 01/04/2017   Allergic rhinitis 01/04/2017   Asthma in adult, mild persistent, uncomplicated 62/22/9798   Restless leg syndrome 01/04/2017   Fibroid 09/26/2016   Dysmenorrhea 09/26/2016   Hypertension 09/26/2016   Plantar fasciitis, bilateral 02/24/2015   Deformity of metatarsal bone of left foot 12/20/2013   Eschar of foot 09/17/2013   Foot lesion 09/17/2013   Bunion of great toe of right foot 05/28/2013   TTS (tarsal tunnel syndrome) 10/31/2012   Pain in joint, ankle and foot 08/08/2012    Past Surgical History:  Procedure Laterality Date   BUNIONECTOMY Right 12/25/2015   DILATATION & CURETTAGE/HYSTEROSCOPY WITH MYOSURE N/A 09/30/2020   Procedure: [place];  Surgeon: [name], MD;  Location: [facility];  Service: Gynecology;  Laterality: N/A;   Heel spur resect w/Plantar Fasciotomy Right 09-22-03   Right foot   NEURECTOMY  FOOT Right 01/26/04   Right foot, Plantar calc.   Neuroplasty digital n. Right 07/02/07   Right x 3   TARSAL TUNNEL RELEASE Right 01/26/04   Right foot   TARSAL TUNNEL RELEASE Right 07/02/07   Right foot    OB History     Gravida  5   Para  4   Term      Preterm  4   AB  1   Living  4      SAB  1   IAB      Ectopic      Multiple      Live Births               Home Medications    Prior to Admission medications   Medication Sig Start Date End Date Taking? Authorizing Provider  albuterol (PROVENTIL HFA;VENTOLIN HFA) 108 (90 Base) MCG/ACT inhaler Inhale 2 puffs into the lungs every 4 (four) hours as needed for wheezing or shortness of breath. 12/04/15   Lysbeth Penner, FNP  albuterol (PROVENTIL) (2.5 MG/3ML) 0.083% nebulizer solution Take 3 mLs (2.5 mg total) by nebulization every 6 (six) hours as needed for wheezing or shortness of breath. 04/21/18   Tasia Catchings, Amy V, PA-C  amitriptyline (ELAVIL) 50 MG tablet Take 50 mg by mouth at bedtime.    [provider]  FEROSUL 325 (65 Fe) MG tablet Take 325 mg by mouth 2 (two) times daily. 07/30/20  [provider]  Fluticasone-Salmeterol (ADVAIR) 250-50 MCG/DOSE AEPB Inhale 1 puff into the lungs every 12 (twelve) hours.    [provider]  gabapentin (NEURONTIN) 300 MG capsule Take 600 mg by mouth 2 (two) times daily.    [provider]  hydrochlorothiazide (HYDRODIURIL) 12.5 MG tablet Take 12.5 mg by mouth daily. 09/20/19   [provider]  ibuprofen (ADVIL,MOTRIN) 800 MG tablet Take 800 mg by mouth every 8 (eight) hours as needed.    [provider]  ipratropium (ATROVENT) 0.06 % nasal spray Place 2 sprays into both nostrils 4 (four) times daily. 04/21/18   Tasia Catchings, Amy V, PA-C  levETIRAcetam (KEPPRA) 500 MG tablet Take 500 mg by mouth 2 (two) times daily.    [provider]  loratadine (CLARITIN) 10 MG tablet Take 10 mg by mouth at bedtime.    [provider]   norethindrone (AYGESTIN) 5 MG tablet TAKE 2 TABLETS(10 MG) BY MOUTH DAILY 12/15/20   [name], MD  omeprazole (PRILOSEC) 20 MG capsule Take 20 mg by mouth 2 (two) times daily. 09/17/19   [provider]  SYMBICORT 160-4.5 MCG/ACT inhaler SMARTSIG:2 Puff(s) By Mouth Twice Daily 05/18/20   [provider]  tiZANidine (ZANAFLEX) 4 MG tablet Take 4 mg by mouth daily as needed. 07/24/20   [provider]  triamcinolone ointment (KENALOG) 0.1 % APPLY EXTERNALLY TO THE AFFECTED AREA TWICE DAILY AS NEEDED 07/08/19   [provider]  Vitamin D, Ergocalciferol, (DRISDOL) 1.25 MG (50000 UNIT) CAPS capsule Take 50,000 Units by mouth once a week. 09/19/19   [provider]    Family History No family history on file.  Social History Social History   Tobacco Use   Smoking status: Never   Smokeless tobacco: Never  Vaping Use   Vaping Use: Never used  Substance Use Topics   Alcohol use: No    Alcohol/week: 0.0 standard drinks   Drug use: No     Allergies   Patient has no known allergies.   Review of Systems Review of Systems  Constitutional: Negative.   HENT:  Positive for sore throat. Negative for congestion.   Respiratory:  Positive for cough. Negative for shortness of breath and wheezing.   Cardiovascular:  Negative for chest pain.  Gastrointestinal: Negative.   Neurological:  Positive for headaches.    Physical Exam Triage Vital Signs ED Triage Vitals  Enc Vitals Group     BP 02/27/21 1348 129/85     Pulse Rate 02/27/21 1348 78     Resp 02/27/21 1348 18     Temp 02/27/21 1348 98.3 F (36.8 C)     Temp src --      SpO2 02/27/21 1348 98 %     Weight --      Height --      Head Circumference --      Peak Flow --      Pain Score 02/27/21 1345 6     Pain Loc --      Pain Edu? --      Excl. in [place]? --    No data found.  Updated Vital Signs BP 129/85   Pulse 78   Temp 98.3 F (36.8 C)   Resp 18   SpO2 98%   Visual  Acuity Right Eye Distance:   Left Eye Distance:   Bilateral Distance:    Right Eye Near:   Left Eye Near:    Bilateral Near:  Physical Exam Vitals and nursing note reviewed.  Constitutional:      General: She is not in acute distress.    Appearance: She is not ill-appearing.  HENT:     Right Ear: Tympanic membrane normal.     Left Ear: Tympanic membrane normal.     Mouth/Throat:     Mouth: Mucous membranes are moist.     Pharynx: No posterior oropharyngeal erythema.     Tonsils: No tonsillar exudate. 0 on the right. 0 on the left.  Cardiovascular:     Rate and Rhythm: Normal rate and regular rhythm.  Pulmonary:     Effort: Pulmonary effort is normal.     Breath sounds: Normal breath sounds.  Abdominal:     Palpations: Abdomen is soft.  Neurological:     Mental Status: She is alert.     UC Treatments / Results  Labs (all labs ordered are listed, but only abnormal results are displayed) Labs Reviewed - No data to display  EKG   Radiology No results found.  Procedures Procedures (including critical care time)  Medications Ordered in UC Medications - No data to display  Initial Impression / Assessment and Plan / UC Course  I have reviewed the triage vital signs and the nursing notes.  Pertinent labs & imaging results that were available during my care of the patient were reviewed by me and considered in my medical decision making (see chart for details).     1.  Acute viral pharyngitis: Chloraseptic throat spray Warm salt water gargle Rescue inhaler use as needed for shortness of breath/wheezing Return to urgent care if symptoms worsen. Final Clinical Impressions(s) / UC Diagnoses   Final diagnoses:  Acute viral pharyngitis     Discharge Instructions      Increase oral fluid intake Warm salt water gargle Start Chloraseptic throat spray Return to urgent care if symptoms worsen.   ED Prescriptions   None    PDMP not reviewed this  encounter.   Chase Picket, MD 02/27/21 1420

## 2021-08-17 ENCOUNTER — Ambulatory Visit (HOSPITAL_COMMUNITY)
Admission: EM | Admit: 2021-08-17 | Discharge: 2021-08-17 | Disposition: A | Discharge status: Home / Self Care | Payer: Medicare Other | Attending: Student | Admitting: Student

## 2021-08-17 ENCOUNTER — Other Ambulatory Visit: Payer: Self-pay

## 2021-08-17 ENCOUNTER — Encounter (HOSPITAL_COMMUNITY): Payer: Self-pay | Admitting: Emergency Medicine

## 2021-08-17 ENCOUNTER — Ambulatory Visit (INDEPENDENT_AMBULATORY_CARE_PROVIDER_SITE_OTHER): Payer: Medicare Other

## 2021-08-17 DIAGNOSIS — M79641 Pain in right hand: Secondary | ICD-10-CM

## 2021-08-17 DIAGNOSIS — M25541 Pain in joints of right hand: Secondary | ICD-10-CM

## 2021-08-17 MED ORDER — PREDNISONE 20 MG PO TABS: 20.0000 mg | ORAL_TABLET | Freq: Every day | ORAL | 0 refills | Status: AC

## 2021-08-17 NOTE — ED Triage Notes (Signed)
Pt reports right hand swelling and pain. States she has fibromyalgia in arms and legs. Unable to pick up items when hand is swelling and feels like pins and needles.  ?

## 2021-08-17 NOTE — Discharge Instructions (Addendum)
-  Your xray looks normal. There is no fracture.  ?-You may have a small amount of arthritis, or even fibromyalgia ?-Rest, ice, tylenol/ibuprofen  ?-Prednisone one pill with breakfast or lunch x3 days.  ?-Follow-up with PCP if symptoms persist   ?

## 2021-08-17 NOTE — ED Provider Notes (Signed)
?[name] ? ? ? ?CSN: [id number] ?Arrival date & time: 08/17/21  1049 ? ? ?  ? ?History   ?Chief Complaint ?Chief Complaint  ?Patient presents with  ? Hand Pain  ? ? ?HPI ?Isabel Shaw is a 51 y.o. female presenting with pain over the third MCP joint.  History of fibromyalgia.  Patient describes sharp pain with movement over the third MCP joint, sometimes radiates up her wrist when it is most severe.  The pain limits ability to pick up items.  Denies sensation changes.  She is right-handed.  Denies trauma but does endorse overuse as she uses her hands a lot at work. ? ?HPI ? ?Past Medical History:  ?Diagnosis Date  ? Asthma   ? Fibromyalgia   ? GERD (gastroesophageal reflux disease)   ? Hypertension   ? Seizures ([place])   ? Uterine fibroid   ? ? ?Patient Active Problem List  ? Diagnosis Date Noted  ? Iron deficiency anemia due to chronic blood loss 08/03/2020  ? Menorrhagia with regular cycle 08/03/2020  ? Possible exposure to STD 11/28/2019  ? Fibromyalgia 01/04/2017  ? Allergic rhinitis 01/04/2017  ? Asthma in adult, mild persistent, uncomplicated 71/69/6789  ? Restless leg syndrome 01/04/2017  ? Fibroid 09/26/2016  ? Dysmenorrhea 09/26/2016  ? Hypertension 09/26/2016  ? Plantar fasciitis, bilateral 02/24/2015  ? Deformity of metatarsal bone of left foot 12/20/2013  ? Eschar of foot 09/17/2013  ? Foot lesion 09/17/2013  ? Bunion of great toe of right foot 05/28/2013  ? TTS (tarsal tunnel syndrome) 10/31/2012  ? Pain in joint, ankle and foot 08/08/2012  ? ? ?Past Surgical History:  ?Procedure Laterality Date  ? BUNIONECTOMY Right 12/25/2015  ? DILATATION & CURETTAGE/HYSTEROSCOPY WITH MYOSURE N/A 09/30/2020  ? Procedure: [place];  Surgeon: [name], MD;  Location: [facility];  Service: Gynecology;  Laterality: N/A;  ? Heel spur resect w/Plantar Fasciotomy Right 09-22-03  ? Right foot  ? NEURECTOMY FOOT Right 01/26/04  ? Right foot, Plantar calc.   ? Neuroplasty digital n. Right 07/02/07  ? Right x 3  ? TARSAL TUNNEL RELEASE Right 01/26/04  ? Right foot  ? TARSAL TUNNEL RELEASE Right 07/02/07  ? Right foot  ? ? ?OB History   ? ? Gravida  ?5  ? Para  ?4  ? Term  ?   ? Preterm  ?4  ? AB  ?1  ? Living  ?4  ?  ? ? SAB  ?1  ? IAB  ?   ? Ectopic  ?   ? Multiple  ?   ? Live Births  ?   ?   ?  ?  ? ? ? ?Home Medications   ? ?Prior to Admission medications   ?Medication Sig Start Date End Date Taking? Authorizing Provider  ?predniSONE (DELTASONE) 20 MG tablet Take 1 tablet (20 mg total) by mouth daily for 3 days. Take with breakfast or lunch. Avoid NSAIDs (ibuprofen, etc) while taking this medication. 08/17/21 08/20/21 Yes Hazel Sams, PA-C  ?albuterol (PROVENTIL HFA;VENTOLIN HFA) 108 (90 Base) MCG/ACT inhaler Inhale 2 puffs into the lungs every 4 (four) hours as needed for wheezing or shortness of breath. 12/04/15   Lysbeth Penner, FNP  ?albuterol (PROVENTIL) (2.5 MG/3ML) 0.083% nebulizer solution Take 3 mLs (2.5 mg total) by nebulization every 6 (six) hours as needed for wheezing or shortness of breath. 04/21/18   Ok Edwards, PA-C  ?amitriptyline (  ELAVIL) 50 MG tablet Take 50 mg by mouth at bedtime.    [provider]  ?FEROSUL 325 (65 Fe) MG tablet Take 325 mg by mouth 2 (two) times daily. 07/30/20   [provider]  ?Fluticasone-Salmeterol (ADVAIR) 250-50 MCG/DOSE AEPB Inhale 1 puff into the lungs every 12 (twelve) hours.    [provider]  ?gabapentin (NEURONTIN) 300 MG capsule Take 600 mg by mouth 2 (two) times daily.    [provider]  ?hydrochlorothiazide (HYDRODIURIL) 12.5 MG tablet Take 12.5 mg by mouth daily. 09/20/19   [provider]  ?ibuprofen (ADVIL,MOTRIN) 800 MG tablet Take 800 mg by mouth every 8 (eight) hours as needed.    [provider]  ?ipratropium (ATROVENT) 0.06 % nasal spray Place 2 sprays into both nostrils 4 (four) times daily. 04/21/18   Tasia Catchings, Amy V, PA-C  ?levETIRAcetam (KEPPRA) 500 MG  tablet Take 500 mg by mouth 2 (two) times daily.    [provider]  ?loratadine (CLARITIN) 10 MG tablet Take 10 mg by mouth at bedtime.    [provider]  ?norethindrone (AYGESTIN) 5 MG tablet TAKE 2 TABLETS(10 MG) BY MOUTH DAILY 12/15/20   [name], MD  ?omeprazole (PRILOSEC) 20 MG capsule Take 20 mg by mouth 2 (two) times daily. 09/17/19   [provider]  ?Dellis Anes 160-4.5 MCG/ACT inhaler SMARTSIG:2 Puff(s) By Mouth Twice Daily 05/18/20   [provider]  ?tiZANidine (ZANAFLEX) 4 MG tablet Take 4 mg by mouth daily as needed. 07/24/20   [provider]  ?Vitamin D, Ergocalciferol, (DRISDOL) 1.25 MG (50000 UNIT) CAPS capsule Take 50,000 Units by mouth once a week. 09/19/19   [provider]  ? ? ?Family History ?History reviewed. No pertinent family history. ? ?Social History ?Social History  ? ?Tobacco Use  ? Smoking status: Never  ? Smokeless tobacco: Never  ?Vaping Use  ? Vaping Use: Never used  ?Substance Use Topics  ? Alcohol use: No  ?  Alcohol/week: 0.0 standard drinks  ? Drug use: No  ? ? ? ?Allergies   ?Patient has no known allergies. ? ? ?Review of Systems ?Review of Systems  ?Musculoskeletal:   ?     R hand pain  ?All other systems reviewed and are negative. ? ? ?Physical Exam ?Triage Vital Signs ?ED Triage Vitals  ?Enc Vitals Group  ?   BP 08/17/21 1256 (!) 144/87  ?   Pulse Rate 08/17/21 1256 (!) 58  ?   Resp 08/17/21 1256 16  ?   Temp 08/17/21 1256 98.5 ?F (36.9 ?C)  ?   Temp Source 08/17/21 1256 Oral  ?   SpO2 08/17/21 1256 100 %  ?   Weight 08/17/21 1255 151 lb 3.8 oz (68.6 kg)  ?   Height 08/17/21 1255 '4\' 11"'$  (1.499 m)  ?   Head Circumference --   ?   Peak Flow --   ?   Pain Score 08/17/21 1255 7  ?   Pain Loc --   ?   Pain Edu? --   ?   Excl. in [place]? --   ? ?No data found. ? ?Updated Vital Signs ?BP (!) 144/87 (BP Location: Right Arm)   Pulse (!) 58   Temp 98.5 ?F (36.9 ?C) (Oral)   Resp 16   Ht '4\' 11"'$  (1.499 m)   Wt 151 lb 3.8 oz (68.6 kg)    SpO2 100%   BMI 30.55 kg/m?  ? ?Visual Acuity ?Right Eye  Distance:   ?Left Eye Distance:   ?Bilateral Distance:   ? ?Right Eye Near:   ?Left Eye Near:    ?Bilateral Near:    ? ?Physical Exam ?Vitals reviewed.  ?Constitutional:   ?   General: She is not in acute distress. ?   Appearance: Normal appearance. She is not ill-appearing.  ?HENT:  ?   Head: Normocephalic and atraumatic.  ?Pulmonary:  ?   Effort: Pulmonary effort is normal.  ?Musculoskeletal:  ?   Comments: R hand - no skin changes or swelling. TTP 3rd MCP joint. Pain out of proportion to exam. Pain elicited with movement. Grip strength 5/5. Sensation intact. No bony deformity. No snuffbox tenderness. Radial pulse 2+, cap refill <2 seconds.   ?Neurological:  ?   General: No focal deficit present.  ?   Mental Status: She is alert and oriented to person, place, and time.  ?Psychiatric:     ?   Mood and Affect: Mood normal.     ?   Behavior: Behavior normal.     ?   Thought Content: Thought content normal.     ?   Judgment: Judgment normal.  ? ? ? ?UC Treatments / Results  ?Labs ?(all labs ordered are listed, but only abnormal results are displayed) ?Labs Reviewed - No data to display ? ?EKG ? ? ?Radiology ?DG Hand Complete Right ? ?Result Date: 08/17/2021 ?CLINICAL DATA:  3rd MCP joint tenderness. No trauma but endorses overuse. history fibromyalgia. EXAM: RIGHT HAND - COMPLETE 3+ VIEW COMPARISON:  None. FINDINGS: There is no evidence of fracture or dislocation. There is no evidence of arthropathy or other focal bone abnormality. Soft tissues are unremarkable. IMPRESSION: No acute osseous abnormality.  No significant arthropathy. Electronically Signed   By: Ilona Sorrel M.D.   On: 08/17/2021 13:41   ? ?Procedures ?Procedures (including critical care time) ? ?Medications Ordered in UC ?Medications - No data to display ? ?Initial Impression / Assessment and Plan / UC Course  ?I have reviewed the triage vital signs and the nursing notes. ? ?Pertinent labs &  imaging results that were available during my care of the patient were reviewed by me and considered in my medical decision making (see chart for details). ? ?  ? ?This patient is a very pleasant 52 y.o. year old f

## 2022-06-06 ENCOUNTER — Other Ambulatory Visit: Payer: Self-pay | Admitting: Internal Medicine

## 2022-06-07 LAB — LIPID PANEL
Cholesterol: 164 mg/dL (ref <200)
HDL: 67 mg/dL (ref >=50)
LDL Cholesterol (Calc): 80 mg/dL (calc)
Non-HDL Cholesterol (Calc): 97 mg/dL (calc) (ref <130)
Total CHOL/HDL Ratio: 2.4 (calc) (ref <5.0)
Triglycerides: 91 mg/dL (ref <150)

## 2022-06-07 LAB — CBC
HCT: 39.5 % (ref 35.0–45.0)
Hemoglobin: 13.3 g/dL (ref 11.7–15.5)
MCH: 27.3 pg (ref 27.0–33.0)
MCHC: 33.7 g/dL (ref 32.0–36.0)
MCV: 80.9 fL (ref 80.0–100.0)
MPV: 11.3 fL (ref 7.5–12.5)
Platelets: 335 10*3/uL (ref 140–400)
RBC: 4.88 10*6/uL (ref 3.80–5.10)
RDW: 13.6 % (ref 11.0–15.0)
WBC: 4.5 10*3/uL (ref 3.8–10.8)

## 2022-06-07 LAB — COMPLETE METABOLIC PANEL WITH GFR
AG Ratio: 1.5 (calc) (ref 1.0–2.5)
ALT: 11 U/L (ref 6–29)
AST: 13 U/L (ref 10–35)
Albumin: 4.4 g/dL (ref 3.6–5.1)
Alkaline phosphatase (APISO): 46 U/L (ref 37–153)
BUN: 16 mg/dL (ref 7–25)
CO2: 28 mmol/L (ref 20–32)
Calcium: 9.9 mg/dL (ref 8.6–10.4)
Chloride: 99 mmol/L (ref 98–110)
Creat: 0.71 mg/dL (ref 0.50–1.03)
Globulin: 2.9 g/dL (calc) (ref 1.9–3.7)
Glucose, Bld: 89 mg/dL (ref 65–99)
Potassium: 3.4 mmol/L — ABNORMAL LOW (ref 3.5–5.3)
Sodium: 139 mmol/L (ref 135–146)
Total Bilirubin: 0.8 mg/dL (ref 0.2–1.2)
Total Protein: 7.3 g/dL (ref 6.1–8.1)
eGFR: 103 mL/min/{1.73_m2} (ref >=60)

## 2022-06-07 LAB — VITAMIN D 25 HYDROXY (VIT D DEFICIENCY, FRACTURES): Vit D, 25-Hydroxy: 88 ng/mL (ref 30–100)

## 2022-06-07 LAB — TSH: TSH: 1.88 mIU/L

## 2024-02-05 ENCOUNTER — Other Ambulatory Visit (HOSPITAL_COMMUNITY)
Admission: RE | Admit: 2024-02-05 | Discharge: 2024-02-05 | Disposition: A | Discharge status: Home / Self Care | Source: Ambulatory Visit | Attending: Obstetrics and Gynecology | Admitting: Obstetrics and Gynecology

## 2024-02-05 ENCOUNTER — Encounter: Payer: Self-pay | Admitting: Obstetrics and Gynecology

## 2024-02-05 ENCOUNTER — Ambulatory Visit: Admitting: Obstetrics and Gynecology

## 2024-02-05 VITALS — BP 136/98 | HR 72 | Wt 167.0 lb

## 2024-02-05 DIAGNOSIS — Z1331 Encounter for screening for depression: Secondary | ICD-10-CM

## 2024-02-05 DIAGNOSIS — L292 Pruritus vulvae: Secondary | ICD-10-CM | POA: Diagnosis not present

## 2024-02-05 LAB — CERVICOVAGINAL ANCILLARY ONLY
Bacterial Vaginitis (gardnerella): POSITIVE — AB
Candida Glabrata: NEGATIVE
Candida Vaginitis: NEGATIVE
Chlamydia: NEGATIVE
Comment: NEGATIVE
Comment: NEGATIVE
Comment: NEGATIVE
Comment: NEGATIVE
Comment: NEGATIVE
Comment: NORMAL
Neisseria Gonorrhea: NEGATIVE
Trichomonas: NEGATIVE

## 2024-02-05 MED ORDER — CLOTRIMAZOLE-BETAMETHASONE 1-0.05 % EX CREA: 1.0000 | TOPICAL_CREAM | Freq: Every day | CUTANEOUS | 0 refills | Status: AC

## 2024-02-05 NOTE — Progress Notes (Signed)
 "   GYNECOLOGY VISIT  Patient name: Isabel Shaw MRN [id number]  Date of birth: 09-06-1970 Chief Complaint:   Gynecologic Exam  History:  Discussed the use of AI scribe software for clinical note transcription with the patient, who gave verbal consent to proceed.  History of Present Illness Isabel Shaw is a 53 year old female who presents with menstrual irregularities and a vulvar rash.  She has been experiencing changes in her menstrual cycle following a prior procedure. Her periods had initially improved, but she recently experienced a cycle that was longer and heavier than usual, requiring her to change pads six times in four hours. Her most recent period was normal. Typically, her cycles last four days and are not too heavy. She experiences unbearable cramps during the first two days, which are alleviated by ibuprofen  and Midol , with Midol  providing better relief.  She also has a rash characterized by itching and small bumps, which she describes as 'unbearable.' She took a leftover antibiotic, a pink pill of 500 mg, which temporarily resolved the symptoms for four to five days before they recurred. She does not recall the name of the medication but believes it was used previously for a UTI. (Later reported amoxicillin ). She denies any discharge, pus, or smell. She has been scratching the area due to the itching. Applied vaseline to the area which has provided some relief but is concerned as to why she is having the itching.   Her sister began menopause at age 28 and is currently 22, having not had a cycle for about six months. She is sexually active with a long-term partner.    The following portions of the patient's history were reviewed and updated as appropriate: allergies, current medications, past family history, past medical history, past social history, past surgical history and problem list.   Health Maintenance:   Last pap     Component Value Date/Time   DIAGPAP   09/30/2020 0851    - Negative for Intraepithelial Lesions or Malignancy (NILM)   DIAGPAP - Benign reactive/reparative changes 09/30/2020 0851   HPVHIGH Negative 09/30/2020 0851   ADEQPAP  09/30/2020 0851    Satisfactory but limited for evaluation with partially obscuring   ADEQPAP  09/30/2020 0851    inflammation; transformation zone component present.    Health Maintenance  Topic Date Due   Medicare Annual Wellness Visit  Never done   HIV Screening  Never done   Hepatitis C Screening  Never done   DTaP/Tdap/Td vaccine (1 - Tdap) Never done   Pneumococcal Vaccine for age over 36 (1 of 2 - PCV) Never done   Hepatitis B Vaccine (1 of 3 - 19+ 3-dose series) Never done   Breast Cancer Screening  Never done   Colon Cancer Screening  Never done   Zoster (Shingles) Vaccine (1 of 2) Never done   Flu Shot  Never done   COVID-19 Vaccine (1 - 2024-25 season) Never done   Pap with HPV screening  09/30/2025   HPV Vaccine  Aged Out   Meningitis B Vaccine  Aged Out      Review of Systems:  Pertinent items are noted in HPI. Comprehensive review of systems was otherwise negative.   Objective:  Physical Exam BP (!) 143/102   Pulse 76   Wt 167 lb (75.8 kg)   LMP 01/27/2024   BMI 33.73 kg/m    Physical Exam Vitals and nursing note reviewed. Exam conducted with a chaperone present.  Constitutional:  Appearance: Normal appearance.  HENT:     Head: Normocephalic and atraumatic.  Pulmonary:     Effort: Pulmonary effort is normal.  Genitourinary:    Comments: Scattere 1mm flesh colored papules with singular excoriated papule on right upper labia majora Skin:    General: Skin is warm and dry.  Neurological:     General: No focal deficit present.     Mental Status: She is alert.  Psychiatric:        Mood and Affect: Mood normal.        Behavior: Behavior normal.        Thought Content: Thought content normal.        Judgment: Judgment normal.        Assessment & Plan:    Assessment & Plan Vulvar pruritus with rash and bumps - Perform STD testing. - Prescribe Lotrisone  cream for topical application on the vulva. - Advise application of Lotrisone  cream before sleep and Vaseline during the day as a barrier. - Await results of swabs to guide further treatment.  Perimenopausal menstrual changes with dysmenorrhea Perimenopausal menstrual changes with dysmenorrhea. Recent cycle normal, previous cycle prolonged and heavy. Severe cramps during the first two days of menstruation, relieved by ibuprofen  and Midol . Family history suggests menopause onset around age 33-55. - Monitor menstrual cycles for recurrence of prolonged heavy bleeding. - Consider repeating ultrasound if prolonged heavy bleeding recurs.    Carter Quarry, MD [facility], [facility] "

## 2024-02-07 ENCOUNTER — Ambulatory Visit: Payer: Self-pay | Admitting: Obstetrics and Gynecology

## 2024-02-07 LAB — HERPES SIMPLEX VIRUS CULTURE

## 2024-05-14 ENCOUNTER — Ambulatory Visit (INDEPENDENT_AMBULATORY_CARE_PROVIDER_SITE_OTHER)

## 2024-05-14 ENCOUNTER — Ambulatory Visit (HOSPITAL_COMMUNITY)
Admission: EM | Admit: 2024-05-14 | Discharge: 2024-05-14 | Disposition: A | Discharge status: Home / Self Care | Attending: Emergency Medicine | Admitting: Emergency Medicine

## 2024-05-14 ENCOUNTER — Encounter (HOSPITAL_COMMUNITY): Payer: Self-pay | Admitting: Emergency Medicine

## 2024-05-14 DIAGNOSIS — J45901 Unspecified asthma with (acute) exacerbation: Secondary | ICD-10-CM

## 2024-05-14 DIAGNOSIS — R059 Cough, unspecified: Secondary | ICD-10-CM | POA: Diagnosis not present

## 2024-05-14 MED ORDER — PREDNISONE 20 MG PO TABS: 40.0000 mg | ORAL_TABLET | Freq: Every day | ORAL | 0 refills | Status: AC

## 2024-05-14 MED ORDER — ALBUTEROL SULFATE HFA 108 (90 BASE) MCG/ACT IN AERS: 1.0000 | INHALATION_SPRAY | Freq: Four times a day (QID) | RESPIRATORY_TRACT | 0 refills | Status: AC | PRN

## 2024-05-14 MED ORDER — PROMETHAZINE-DM 6.25-15 MG/5ML PO SYRP: 5.0000 mL | ORAL_SOLUTION | Freq: Four times a day (QID) | ORAL | 0 refills | Status: AC | PRN

## 2024-05-14 NOTE — ED Provider Notes (Signed)
 " [facility]    CSN: [id number] Arrival date & time: 05/14/24  9046      History   Chief Complaint Chief Complaint  Patient presents with   Cough    HPI Isabel Shaw is a 53 y.o. female.   Patient presents to clinic over concern of cough x2 weeks   First week cough was not too bad This past week cough has worsened and she is having central chest pain / tightness with coughing  Cough is worse at night and has been keeping her up  Albuterol  inhaler last used last night, has been helping some Has been SOB and wheezing sometimes  Cough is dry, non-productive   Has not body aches, chills or fever or recent sick contacts   The history is provided by the patient and medical records.  Cough   Past Medical History:  Diagnosis Date   Asthma    Fibromyalgia    GERD (gastroesophageal reflux disease)    Hypertension    Seizures (HCC)    Uterine fibroid     Patient Active Problem List   Diagnosis Date Noted   Iron deficiency anemia due to chronic blood loss 08/03/2020   Menorrhagia with regular cycle 08/03/2020   Possible exposure to STD 11/28/2019   Fibromyalgia 01/04/2017   Allergic rhinitis 01/04/2017   Asthma in adult, mild persistent, uncomplicated 01/04/2017   Restless leg syndrome 01/04/2017   Fibroid 09/26/2016   Dysmenorrhea 09/26/2016   Hypertension 09/26/2016   Plantar fasciitis, bilateral 02/24/2015   Deformity of metatarsal bone of left foot 12/20/2013   Eschar of foot 09/17/2013   Foot lesion 09/17/2013   Bunion of great toe of right foot 05/28/2013   TTS (tarsal tunnel syndrome) 10/31/2012   Pain in joint, ankle and foot 08/08/2012    Past Surgical History:  Procedure Laterality Date   BUNIONECTOMY Right 12/25/2015   DILATATION & CURETTAGE/HYSTEROSCOPY WITH MYOSURE N/A 09/30/2020   Procedure: DILATATION & CURETTAGE/HYSTEROSCOPY WITH MYOSURE;  Surgeon: Fredirick Glenys RAMAN, MD;  Location: [facility];  Service: Gynecology;   Laterality: N/A;   Heel spur resect w/Plantar Fasciotomy Right 09-22-03   Right foot   NEURECTOMY FOOT Right 01/26/04   Right foot, Plantar calc.   Neuroplasty digital n. Right 07/02/07   Right x 3   TARSAL TUNNEL RELEASE Right 01/26/04   Right foot   TARSAL TUNNEL RELEASE Right 07/02/07   Right foot    OB History     Gravida  5   Para  4   Term      Preterm  4   AB  1   Living  4      SAB  1   IAB      Ectopic      Multiple      Live Births               Home Medications    Prior to Admission medications  Medication Sig Start Date End Date Taking? Authorizing Provider  albuterol  (PROVENTIL  HFA;VENTOLIN  HFA) 108 (90 Base) MCG/ACT inhaler Inhale 2 puffs into the lungs every 4 (four) hours as needed for wheezing or shortness of breath. 12/04/15  Yes Oxford, Elsie PARAS, FNP  albuterol  (PROVENTIL ) (2.5 MG/3ML) 0.083% nebulizer solution Take 3 mLs (2.5 mg total) by nebulization every 6 (six) hours as needed for wheezing or shortness of breath. 04/21/18  Yes Yu, Amy V, PA-C  albuterol  (VENTOLIN  HFA) 108 (90 Base) MCG/ACT inhaler Inhale  1-2 puffs into the lungs every 6 (six) hours as needed for wheezing or shortness of breath. 05/14/24  Yes Jonica Bickhart  N, FNP  amitriptyline (ELAVIL) 50 MG tablet Take 50 mg by mouth at bedtime.   Yes [provider]  clotrimazole -betamethasone  (LOTRISONE ) cream Apply 1 Application topically daily. 02/05/24  Yes Ajewole, Christana, MD  FEROSUL 325 (65 Fe) MG tablet Take 325 mg by mouth 2 (two) times daily. 07/30/20  Yes [provider]  Fluticasone -Salmeterol (ADVAIR) 250-50 MCG/DOSE AEPB Inhale 1 puff into the lungs every 12 (twelve) hours.   Yes [provider]  gabapentin  (NEURONTIN ) 300 MG capsule Take 600 mg by mouth 2 (two) times daily.   Yes [provider]  hydrochlorothiazide (HYDRODIURIL) 12.5 MG tablet Take 12.5 mg by mouth daily. 09/20/19  Yes [provider]  ibuprofen  (ADVIL ,MOTRIN )  800 MG tablet Take 800 mg by mouth every 8 (eight) hours as needed.   Yes [provider]  ipratropium (ATROVENT ) 0.06 % nasal spray Place 2 sprays into both nostrils 4 (four) times daily. 04/21/18  Yes Yu, Amy V, PA-C  levETIRAcetam  (KEPPRA ) 500 MG tablet Take 500 mg by mouth 2 (two) times daily.   Yes [provider]  loratadine (CLARITIN) 10 MG tablet Take 10 mg by mouth at bedtime.   Yes [provider]  omeprazole (PRILOSEC) 20 MG capsule Take 20 mg by mouth 2 (two) times daily. 09/17/19  Yes [provider]  predniSONE  (DELTASONE ) 20 MG tablet Take 2 tablets (40 mg total) by mouth daily with breakfast for 5 days. 05/14/24 05/19/24 Yes Kayveon Lennartz  N, FNP  promethazine -dextromethorphan (PROMETHAZINE -DM) 6.25-15 MG/5ML syrup Take 5 mLs by mouth 4 (four) times daily as needed for cough. 05/14/24  Yes Dreama, Hershey Knauer  N, FNP  SYMBICORT 160-4.5 MCG/ACT inhaler SMARTSIG:2 Puff(s) By Mouth Twice Daily 05/18/20  Yes [provider]  tiZANidine (ZANAFLEX) 4 MG tablet Take 4 mg by mouth daily as needed. 07/24/20  Yes [provider]  Vitamin D , Ergocalciferol , (DRISDOL) 1.25 MG (50000 UNIT) CAPS capsule Take 50,000 Units by mouth once a week. 09/19/19  Yes [provider]  norethindrone  (AYGESTIN ) 5 MG tablet TAKE 2 TABLETS(10 MG) BY MOUTH DAILY Patient not taking: Reported on 02/05/2024 12/15/20   Fredirick Glenys RAMAN, MD    Family History History reviewed. No pertinent family history.  Social History Social History[1]   Allergies   Patient has no known allergies.   Review of Systems Review of Systems  Per HPI  Physical Exam Triage Vital Signs ED Triage Vitals  Encounter Vitals Group     BP 05/14/24 1102 129/88     Girls Systolic BP Percentile --      Girls Diastolic BP Percentile --      Boys Systolic BP Percentile --      Boys Diastolic BP Percentile --      Pulse Rate 05/14/24 1102 72     Resp 05/14/24 1102 18     Temp  05/14/24 1102 98 F (36.7 C)     Temp Source 05/14/24 1102 Oral     SpO2 05/14/24 1102 95 %     Weight 05/14/24 1100 163 lb (73.9 kg)     Height 05/14/24 1100 4' 11 (1.499 m)     Head Circumference --      Peak Flow --      Pain Score 05/14/24 1100 0     Pain Loc --      Pain Education --  Exclude from Growth Chart --    No data found.  Updated Vital Signs BP 129/88 (BP Location: Left Arm)   Pulse 72   Temp 98 F (36.7 C) (Oral)   Resp 18   Ht 4' 11 (1.499 m)   Wt 163 lb (73.9 kg)   LMP 05/07/2024   SpO2 95%   BMI 32.92 kg/m   Visual Acuity Right Eye Distance:   Left Eye Distance:   Bilateral Distance:    Right Eye Near:   Left Eye Near:    Bilateral Near:     Physical Exam Vitals and nursing note reviewed.  Constitutional:      Appearance: Normal appearance.  HENT:     Head: Normocephalic and atraumatic.     Right Ear: External ear normal.     Left Ear: External ear normal.     Nose: Nose normal.     Mouth/Throat:     Mouth: Mucous membranes are moist.  Eyes:     Conjunctiva/sclera: Conjunctivae normal.  Cardiovascular:     Rate and Rhythm: Normal rate and regular rhythm.     Heart sounds: Normal heart sounds. No murmur heard. Pulmonary:     Effort: Pulmonary effort is normal. No respiratory distress.     Breath sounds: Normal breath sounds. No wheezing.  Skin:    General: Skin is warm and dry.  Neurological:     General: No focal deficit present.     Mental Status: She is alert.  Psychiatric:        Mood and Affect: Mood normal.      UC Treatments / Results  Labs (all labs ordered are listed, but only abnormal results are displayed) Labs Reviewed - No data to display  EKG   Radiology No results found.  Procedures Procedures (including critical care time)  Medications Ordered in UC Medications - No data to display  Initial Impression / Assessment and Plan / UC Course  I have reviewed the triage vital signs and the nursing  notes.  Pertinent labs & imaging results that were available during my care of the patient were reviewed by me and considered in my medical decision making (see chart for details).  Was and triage reviewed, patient is hemodynamically stable.  Lungs vesicular, heart with regular rate and rhythm.  Chest x-ray by my interpretation does not show acute infiltrate, awaiting official radiology overread.  Due to persisting cough, wheezing and shortness of breath with central chest pain will treat with prednisone  burst for asthma exacerbation/bronchitis.  Will refill albuterol  inhaler.  Cough management discussed.  Plan of care, follow-up care and return precautions given, no questions at this time.    Final Clinical Impressions(s) / UC Diagnoses   Final diagnoses:  Cough, unspecified type  Exacerbation of asthma, unspecified asthma severity, unspecified whether persistent     Discharge Instructions      Take the prednisone  daily with breakfast to help with wheezing and open of the airway Use your albuterol  inhaler every 6 hours as needed for wheezing or shortness of breath Use the cough syrup up to 4 times daily to help suppress the cough.  This medication may cause drowsiness or sedation Ensure you are drinking at least 64 ounces of water daily to help loosen any secretions, sleep in the humidifier at night may help as well  Symptoms should improve with medication.  If no improvement or any changes seek follow-up care.     ED Prescriptions     Medication  Sig Dispense Auth. Provider   promethazine -dextromethorphan (PROMETHAZINE -DM) 6.25-15 MG/5ML syrup Take 5 mLs by mouth 4 (four) times daily as needed for cough. 118 mL Dreama, Satina Jerrell  N, FNP   predniSONE  (DELTASONE ) 20 MG tablet Take 2 tablets (40 mg total) by mouth daily with breakfast for 5 days. 10 tablet Dreama, Laityn Bensen  N, FNP   albuterol  (VENTOLIN  HFA) 108 (90 Base) MCG/ACT inhaler Inhale 1-2 puffs into the lungs every 6  (six) hours as needed for wheezing or shortness of breath. 18 g Dreama Erenest SAILOR, FNP      PDMP not reviewed this encounter.     [1]  Social History Tobacco Use   Smoking status: Never   Smokeless tobacco: Never  Vaping Use   Vaping status: Never Used  Substance Use Topics   Alcohol use: No    Alcohol/week: 0.0 standard drinks of alcohol   Drug use: No     Dreama, Mandisa Persinger  N, FNP 05/14/24 1153  "

## 2024-05-14 NOTE — Discharge Instructions (Signed)
 Take the prednisone  daily with breakfast to help with wheezing and open of the airway Use your albuterol  inhaler every 6 hours as needed for wheezing or shortness of breath Use the cough syrup up to 4 times daily to help suppress the cough.  This medication may cause drowsiness or sedation Ensure you are drinking at least 64 ounces of water daily to help loosen any secretions, sleep in the humidifier at night may help as well  Symptoms should improve with medication.  If no improvement or any changes seek follow-up care.

## 2024-05-14 NOTE — ED Triage Notes (Signed)
 Patient c/o cough x 2 weeks, history of asthma, worse at night when laying down.  Denies SOB.  Patient has taken Thera-Flu, Emerg-C, Robitussin and Ibuprofen  800mg .
